# Patient Record
Sex: Male | Born: 1990 | Race: Black or African American | Hispanic: No | Marital: Single | Smoking: Current some day smoker
Health system: Southern US, Community
[De-identification: ages and names within clinical notes are randomized; demographics above are authoritative.]

## PROBLEM LIST (undated history)

## (undated) DIAGNOSIS — J45909 Unspecified asthma, uncomplicated: Secondary | ICD-10-CM

## (undated) DIAGNOSIS — I10 Essential (primary) hypertension: Secondary | ICD-10-CM

## (undated) DIAGNOSIS — E119 Type 2 diabetes mellitus without complications: Secondary | ICD-10-CM

---
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
## 2016-01-10 ENCOUNTER — Emergency Department
Admission: EM | Admit: 2016-01-10 | Discharge: 2016-01-10 | Disposition: A | Discharge status: Home / Self Care | Payer: Commercial Managed Care - PPO | Attending: Emergency Medicine | Admitting: Emergency Medicine

## 2016-01-10 ENCOUNTER — Encounter: Payer: Self-pay | Admitting: Emergency Medicine

## 2016-01-10 DIAGNOSIS — A549 Gonococcal infection, unspecified: Secondary | ICD-10-CM | POA: Diagnosis not present

## 2016-01-10 DIAGNOSIS — R300 Dysuria: Secondary | ICD-10-CM | POA: Diagnosis present

## 2016-01-10 LAB — URINALYSIS COMPLETE WITH MICROSCOPIC (ARMC ONLY)
BILIRUBIN URINE: NEGATIVE
Bacteria, UA: NONE SEEN
GLUCOSE, UA: NEGATIVE mg/dL
KETONES UR: NEGATIVE mg/dL
NITRITE: NEGATIVE
Protein, ur: NEGATIVE mg/dL
SPECIFIC GRAVITY, URINE: 1.019 (ref 1.005–1.030)
Squamous Epithelial / LPF: NONE SEEN
pH: 5 (ref 5.0–8.0)

## 2016-01-10 LAB — CHLAMYDIA/NGC RT PCR (ARMC ONLY)
Chlamydia Tr: NOT DETECTED
N gonorrhoeae: DETECTED — AB

## 2016-01-10 MED ORDER — CEFTRIAXONE SODIUM 250 MG IJ SOLR
250.0000 mg | Freq: Once | INTRAMUSCULAR | Status: AC
  Administered 2016-01-10: 250 mg via INTRAMUSCULAR
  Filled 2016-01-10: qty 250

## 2016-01-10 MED ORDER — AZITHROMYCIN 500 MG PO TABS
1000.0000 mg | ORAL_TABLET | Freq: Once | ORAL | Status: AC
  Administered 2016-01-10: 1000 mg via ORAL
  Filled 2016-01-10: qty 2

## 2016-01-10 NOTE — ED Notes (Signed)
See triage note  Burning with urination  No discharge

## 2016-01-10 NOTE — ED Provider Notes (Signed)
[facility] Emergency Department Provider Note  ____________________________________________  Time seen: Approximately 11:34 AM  I have reviewed the triage vital signs and the nursing notes.   HISTORY  Chief Complaint Dysuria    HPI Rodney PaymentJesse L Banning Jr. is a 25 y.o. male , NAD, presents to the emergency department with 2 day history of dysuria and yellow urethral discharge.States he was sent home early from work today to come to the emergency department for such. Has had some lower abdominal pressure that is alleviated with urination. Denies any fever, chills, night sweats, hematuria. Has had one male sexual partner in the last 6 months but has had no sexual activity in the last 3 months. No history of gonorrhea, chlamydia, trichomoniasis, HIV, hepatitis infections in the past. Patient currently declines HIV and hepatitis evaluation.   History reviewed. No pertinent past medical history.  There are no active problems to display for this patient.   History reviewed. No pertinent past surgical history.  No current outpatient prescriptions on file.  Allergies Review of patient's allergies indicates no known allergies.  No family history on file.  Social History Social History  Substance Use Topics  . Smoking status: Never Smoker   . Smokeless tobacco: None  . Alcohol Use: None     Review of Systems Constitutional: No fever/chills, fatigue, weight loss Cardiovascular: No chest pain. Respiratory:  No shortness of breath. No wheezing.  Gastrointestinal: Positive abdominal pain.  No nausea, vomiting.  No diarrhea.  No constipation. Genitourinary: Positive for dysuria, urethral discharge, urinary urgency, urinary hesitancy and increased frequency. No hematuria. Musculoskeletal: Negative for back pain.  Skin: Negative for rash, skin sores. Neurological: Negative for headaches, focal weakness or numbness. 10-point ROS otherwise  negative.  ____________________________________________   PHYSICAL EXAM:  VITAL SIGNS: ED Triage Vitals  Enc Vitals Group     BP 01/10/16 0928 151/77 mmHg     Pulse Rate 01/10/16 0928 93     Resp 01/10/16 0928 20     Temp 01/10/16 0928 98.9 F (37.2 C)     Temp Source 01/10/16 0928 Oral     SpO2 01/10/16 0928 100 %     Weight 01/10/16 0928 260 lb (117.935 kg)     Height 01/10/16 0928 5\' 8"  (1.727 m)     Head Cir --      Peak Flow --      Pain Score --      Pain Loc --      Pain Edu? --      Excl. in GC? --     Constitutional: Alert and oriented. Well appearing and in no acute distress. Eyes: Conjunctivae are normal.  Head: Atraumatic. Hematological/Lymphatic/Immunilogical: No cervical lymphadenopathy. Cardiovascular: Normal rate, regular rhythm. Normal S1 and S2.  Good peripheral circulation. Respiratory: Normal respiratory effort without tachypnea or retractions. Lungs CTAB. Gastrointestinal: Soft and nontender in all quadrants. No distention, guarding. No CVA tenderness. Neurologic:  Normal speech and language. No gross focal neurologic deficits are appreciated.  Skin:  Skin is warm, dry and intact. No rash noted. Psychiatric: Mood and affect are normal. Speech and behavior are normal. Patient exhibits appropriate insight and judgement.   ____________________________________________   LABS (all labs ordered are listed, but only abnormal results are displayed)  Labs Reviewed  CHLAMYDIA/NGC RT PCR ([facility] ONLY) - Abnormal; Notable for the following:    N gonorrhoeae DETECTED (*)    All other components within normal limits  URINALYSIS COMPLETEWITH MICROSCOPIC ([facility] ONLY) - Abnormal; Notable  for the following:    Color, Urine YELLOW (*)    APPearance CLEAR (*)    Hgb urine dipstick 1+ (*)    Leukocytes, UA TRACE (*)    All other components within normal limits  URINE CULTURE    ____________________________________________  EKG  None ____________________________________________  RADIOLOGY  None ____________________________________________    PROCEDURES  Procedure(s) performed: None    Medications  cefTRIAXone (ROCEPHIN) injection 250 mg (not administered)  azithromycin (ZITHROMAX) tablet 1,000 mg (not administered)     ____________________________________________   INITIAL IMPRESSION / ASSESSMENT AND PLAN / ED COURSE  Pertinent lab results that were available during my care of the patient were reviewed by me and considered in my medical decision making (see chart for details).  Patient's diagnosis is consistent with gonorrhea. Patient was given Rocephin and azithromycin while in the emergency department. Patient is to follow up with [facility] Department in 2 weeks for repeat testing. Patient is advised to rest and from all sexual intercourse until he is seen by the health department for repeat testing. Patient is given ED precautions to return to the ED for any worsening or new symptoms.    ____________________________________________  FINAL CLINICAL IMPRESSION(S) / ED DIAGNOSES  Final diagnoses:  Gonorrhea      NEW MEDICATIONS STARTED DURING THIS VISIT:  New Prescriptions   No medications on file         Hope Pigeon, PA-C 01/10/16 1431  Minna Antis, MD 01/10/16 1506

## 2016-01-10 NOTE — ED Notes (Signed)
Reports burning with urination 

## 2016-01-10 NOTE — Discharge Instructions (Signed)
Gonorrhea Gonorrhea is an infection that can cause serious problems. If left untreated, the infection may:   Damage the male or male organs.   Cause women to be unable to have children (sterility).   Harm a fetus if the infected woman is pregnant.  It is important to get treatment for gonorrhea as soon as possible. It is also necessary that all your sexual partners be tested for the infection.  CAUSES  Gonorrhea is caused by bacteria called Neisseria gonorrhoeae. The infection is spread from person to person, usually by sexual contact (such as by anal, vaginal, or oral means). A newborn can contract the infection from his or her mother during birth.  RISK FACTORS  Being a woman younger than 25 years of age who is sexually active.  Being a woman 25 years of age or older who has:  A new sex partner.  More than one sex partner.  A sex partner who has a sexually transmitted disease (STD).  Using condoms inconsistently.  Currently having, or having previously had, an STD.  Exchanging sex or money or drugs. SYMPTOMS  Some people with gonorrhea do not have symptoms. Symptoms may be different in females and males.  Females The most common symptoms are:   Pain in the lower abdomen.   Fever with or without chills.  Other symptoms include:   Abnormal vaginal discharge.   Painful intercourse.   Burning or itching of the vagina or lips of the vagina.   Abnormal vaginal bleeding.   Pain when urinating.   Long-lasting (chronic) pain in the lower abdomen, especially during menstruation or intercourse.   Inability to become pregnant.   Going into premature labor.   Irritation, pain, bleeding, or discharge from the rectum. This may occur if the infection was spread by anal sex.   Sore throat or swollen lymph nodes in the neck. This may occur if the infection was spread by oral sex.  Males The most common symptoms are:   Discharge from the penis.   Pain  or burning during urination.   Pain or swelling in the testicles. Other symptoms may include:   Irritation, pain, bleeding, or discharge from the rectum. This may occur if the infection was spread by anal sex.   Sore throat, fever, or swollen lymph nodes in the neck. This may occur if the infection was spread by oral sex.  DIAGNOSIS  A diagnosis is made after a physical exam is done and a sample of discharge is examined under a microscope for the presence of the bacteria. The discharge may be taken from the urethra, cervix, throat, or rectum.  TREATMENT  Gonorrhea is treated with antibiotic medicines. It is important for treatment to begin as soon as possible. Early treatment may prevent some problems from developing. Do not have sex. Avoid all types of sexual activity for 7 days after treatment is complete and until any sex partners have been treated. HOME CARE INSTRUCTIONS   Take medicines only as directed by your health care provider.   Take your antibiotic medicine as directed by your health care provider. Finish the antibiotic even if you start to feel better. Incomplete treatment will put you at risk for continued infection.   Do not have sex until treatment is complete or as directed by your health care provider.   Keep all follow-up visits as directed by your health care provider.   Not all test results are available during your visit. If your test results are not back   during the visit, make an appointment with your health care provider to find out the results. Do not assume everything is normal if you have not heard from your health care provider or the medical facility. It is your responsibility to get your test results.  If you test positive for gonorrhea, inform your recent sexual partners. They need to be checked for gonorrhea even if they do not have symptoms. They may need treatment, even if they test negative for gonorrhea.  SEEK MEDICAL CARE IF:   You develop any  bad reaction to the medicine you were prescribed. This may include:   A rash.   Nausea.   Vomiting.   Diarrhea.   Your symptoms do not improve after a few days of taking antibiotics.   Your symptoms get worse.   You develop increased pain, such as in the testicles (for males) or in the abdomen (for females).  You have a fever. MAKE SURE YOU:   Understand these instructions.  Will watch your condition.  Will get help right away if you are not doing well or get worse.   This information is not intended to replace advice given to you by your health care provider. Make sure you discuss any questions you have with your health care provider.   Document Released: 10/02/2000 Document Revised: 10/26/2014 Document Reviewed: 04/12/2013 Elsevier Interactive Patient Education 2016 [employer]  

## 2016-01-12 LAB — URINE CULTURE: Culture: NO GROWTH

## 2016-10-30 ENCOUNTER — Emergency Department
Admission: EM | Admit: 2016-10-30 | Discharge: 2016-10-30 | Disposition: A | Discharge status: Home / Self Care | Payer: Commercial Managed Care - PPO | Attending: Emergency Medicine | Admitting: Emergency Medicine

## 2016-10-30 ENCOUNTER — Encounter: Payer: Self-pay | Admitting: Emergency Medicine

## 2016-10-30 DIAGNOSIS — J45909 Unspecified asthma, uncomplicated: Secondary | ICD-10-CM | POA: Diagnosis not present

## 2016-10-30 DIAGNOSIS — R05 Cough: Secondary | ICD-10-CM | POA: Diagnosis present

## 2016-10-30 DIAGNOSIS — J101 Influenza due to other identified influenza virus with other respiratory manifestations: Secondary | ICD-10-CM

## 2016-10-30 DIAGNOSIS — J09X2 Influenza due to identified novel influenza A virus with other respiratory manifestations: Secondary | ICD-10-CM | POA: Insufficient documentation

## 2016-10-30 DIAGNOSIS — F17210 Nicotine dependence, cigarettes, uncomplicated: Secondary | ICD-10-CM | POA: Diagnosis not present

## 2016-10-30 HISTORY — DX: Unspecified asthma, uncomplicated: J45.909

## 2016-10-30 LAB — INFLUENZA PANEL BY PCR (TYPE A & B)
INFLAPCR: POSITIVE — AB
INFLBPCR: NEGATIVE

## 2016-10-30 LAB — COMPREHENSIVE METABOLIC PANEL
ALBUMIN: 4.1 g/dL (ref 3.5–5.0)
ALK PHOS: 74 U/L (ref 38–126)
ALT: 29 U/L (ref 17–63)
ANION GAP: 10 (ref 5–15)
AST: 41 U/L (ref 15–41)
BILIRUBIN TOTAL: 0.7 mg/dL (ref 0.3–1.2)
BUN: 9 mg/dL (ref 6–20)
CALCIUM: 9.1 mg/dL (ref 8.9–10.3)
CO2: 26 mmol/L (ref 22–32)
Chloride: 102 mmol/L (ref 101–111)
Creatinine, Ser: 1.05 mg/dL (ref 0.61–1.24)
GFR calc non Af Amer: >60 mL/min (ref >60)
GLUCOSE: 105 mg/dL — AB (ref 65–99)
POTASSIUM: 3.6 mmol/L (ref 3.5–5.1)
Sodium: 138 mmol/L (ref 135–145)
TOTAL PROTEIN: 8 g/dL (ref 6.5–8.1)

## 2016-10-30 LAB — LIPASE, BLOOD: LIPASE: 25 U/L (ref 11–51)

## 2016-10-30 LAB — CBC WITH DIFFERENTIAL/PLATELET
BASOS PCT: 0 %
Basophils Absolute: 0 10*3/uL (ref 0–0.1)
EOS ABS: 0 10*3/uL (ref 0–0.7)
Eosinophils Relative: 0 %
HEMATOCRIT: 47.2 % (ref 40.0–52.0)
Hemoglobin: 15.8 g/dL (ref 13.0–18.0)
LYMPHS ABS: 1.7 10*3/uL (ref 1.0–3.6)
Lymphocytes Relative: 32 %
MCH: 28.2 pg (ref 26.0–34.0)
MCHC: 33.4 g/dL (ref 32.0–36.0)
MCV: 84.6 fL (ref 80.0–100.0)
MONO ABS: 0.7 10*3/uL (ref 0.2–1.0)
MONOS PCT: 14 %
NEUTROS ABS: 2.8 10*3/uL (ref 1.4–6.5)
Neutrophils Relative %: 54 %
Platelets: 207 10*3/uL (ref 150–440)
RBC: 5.58 MIL/uL (ref 4.40–5.90)
RDW: 13.2 % (ref 11.5–14.5)
WBC: 5.3 10*3/uL (ref 3.8–10.6)

## 2016-10-30 MED ORDER — HYDROCOD POLST-CPM POLST ER 10-8 MG/5ML PO SUER: 5.0000 mL | Freq: Two times a day (BID) | ORAL | 0 refills | Status: DC

## 2016-10-30 MED ORDER — ONDANSETRON 4 MG PO TBDP: 4.0000 mg | ORAL_TABLET | Freq: Three times a day (TID) | ORAL | 0 refills | Status: DC | PRN

## 2016-10-30 MED ORDER — ONDANSETRON HCL 4 MG/2ML IJ SOLN
4.0000 mg | Freq: Once | INTRAMUSCULAR | Status: AC
  Administered 2016-10-30: 4 mg via INTRAVENOUS
  Filled 2016-10-30: qty 2

## 2016-10-30 MED ORDER — SODIUM CHLORIDE 0.9 % IV SOLN
Freq: Once | INTRAVENOUS | Status: AC
  Administered 2016-10-30: 07:00:00 via INTRAVENOUS

## 2016-10-30 MED ORDER — MORPHINE SULFATE (PF) 4 MG/ML IV SOLN
4.0000 mg | Freq: Once | INTRAVENOUS | Status: AC
  Administered 2016-10-30: 4 mg via INTRAVENOUS
  Filled 2016-10-30: qty 1

## 2016-10-30 NOTE — ED Provider Notes (Signed)
[facility] Emergency Department Provider Note        Time seen: ----------------------------------------- 6:55 AM on 10/30/2016 -----------------------------------------    I have reviewed the triage vital signs and the nursing notes.   HISTORY  Chief Complaint Abdominal Pain; Cough; and Headache    HPI Rodney PaymentJesse L Vanpelt Jr. is a 26 y.o. male who presents to the ER for cough, congestion and lower abdominal pain for the last week.Patient states she's had fevers, chills, body aches as well as vomiting. He did have diarrhea earlier in the week but this has resolved. Currently still nauseous and has had poor by mouth intake.   Past Medical History:  Diagnosis Date  . Asthma     There are no active problems to display for this patient.   History reviewed. No pertinent surgical history.  Allergies Patient has no known allergies.  Social History Social History  Substance Use Topics  . Smoking status: Current Some Day Smoker  . Smokeless tobacco: Never Used  . Alcohol use No    Review of Systems Constitutional: Negative for fever. Cardiovascular: Negative for chest pain. Respiratory: Negative for shortness of breath. Gastrointestinal: Positive for abdominal pain, vomiting Genitourinary: Negative for dysuria. Musculoskeletal: Negative for back pain. Skin: Negative for rash. Positive for sweats Neurological: Negative for headaches, focal weakness or numbness.  10-point ROS otherwise negative.  ____________________________________________   PHYSICAL EXAM:  VITAL SIGNS: ED Triage Vitals [10/30/16 0621]  Enc Vitals Group     BP 126/85     Pulse Rate (!) 118     Resp 18     Temp 98.3 F (36.8 C)     Temp Source Oral     SpO2 95 %     Weight 275 lb (124.7 kg)     Height 5\' 8"  (1.727 m)     Head Circumference      Peak Flow      Pain Score      Pain Loc      Pain Edu?      Excl. in GC?     Constitutional: Alert and oriented. Well  appearing and in no distress. Eyes: Conjunctivae are normal. PERRL. Normal extraocular movements. ENT   Head: Normocephalic and atraumatic.   Nose: No congestion/rhinnorhea.   Mouth/Throat: Mucous membranes are moist.   Neck: No stridor. Cardiovascular: Rapid rate, regular rhythm. No murmurs, rubs, or gallops. Respiratory: Normal respiratory effort without tachypnea nor retractions. Breath sounds are clear and equal bilaterally. No wheezes/rales/rhonchi. Gastrointestinal: Soft and nontender. Normal bowel sounds Musculoskeletal: Nontender with normal range of motion in all extremities. No lower extremity tenderness nor edema. Neurologic:  Normal speech and language. No gross focal neurologic deficits are appreciated.  Skin:  Skin is warm, dry and intact. No rash noted. Psychiatric: Mood and affect are normal. Speech and behavior are normal.  ____________________________________________  ED COURSE:  Pertinent labs & imaging results that were available during my care of the patient were reviewed by me and considered in my medical decision making (see chart for details). Clinical Course   Patient is in no distress, likely influenza. We will assess with labs and imaging.  Procedures ____________________________________________   LABS (pertinent positives/negatives)  Labs Reviewed  COMPREHENSIVE METABOLIC PANEL - Abnormal; Notable for the following:       Result Value   Glucose, Bld 105 (*)    All other components within normal limits  INFLUENZA PANEL BY PCR (TYPE A & B, H1N1) - Abnormal; Notable for the  following:    Influenza A By PCR POSITIVE (*)    All other components within normal limits  CBC WITH DIFFERENTIAL/PLATELET  LIPASE, BLOOD   ____________________________________________  FINAL ASSESSMENT AND PLAN  Influenza  Plan: Patient with labs as dictated above. Patient's no acute distress, tolerating liquids well here. Symptoms are secondary to influenza. He'll  be discharged with antiemetics, cough medicine and he is stable for outpatient follow-up.   Emily Filbert, MD   Note: This dictation was prepared with Dragon dictation. Any transcriptional errors that result from this process are unintentional    Emily Filbert, MD 10/30/16 [phone]

## 2016-10-30 NOTE — ED Triage Notes (Signed)
Pt ambulatory to triage with steady gait, pt diaphoretic. Pt c/o cough, congestion, lower abdominal pain x1 week.

## 2017-01-24 ENCOUNTER — Encounter: Payer: Self-pay | Admitting: Emergency Medicine

## 2017-01-24 ENCOUNTER — Emergency Department
Admission: EM | Admit: 2017-01-24 | Discharge: 2017-01-24 | Disposition: A | Discharge status: Home / Self Care | Payer: Commercial Managed Care - PPO | Attending: Emergency Medicine | Admitting: Emergency Medicine

## 2017-01-24 DIAGNOSIS — J45909 Unspecified asthma, uncomplicated: Secondary | ICD-10-CM | POA: Diagnosis not present

## 2017-01-24 DIAGNOSIS — J029 Acute pharyngitis, unspecified: Secondary | ICD-10-CM | POA: Insufficient documentation

## 2017-01-24 DIAGNOSIS — F17210 Nicotine dependence, cigarettes, uncomplicated: Secondary | ICD-10-CM | POA: Insufficient documentation

## 2017-01-24 DIAGNOSIS — R05 Cough: Secondary | ICD-10-CM | POA: Diagnosis present

## 2017-01-24 LAB — POCT RAPID STREP A: Streptococcus, Group A Screen (Direct): NEGATIVE

## 2017-01-24 MED ORDER — GI COCKTAIL ~~LOC~~
30.0000 mL | Freq: Once | ORAL | Status: AC
  Administered 2017-01-24: 30 mL via ORAL

## 2017-01-24 MED ORDER — CEPHALEXIN 500 MG PO CAPS: 500.0000 mg | ORAL_CAPSULE | Freq: Two times a day (BID) | ORAL | 0 refills | Status: DC

## 2017-01-24 MED ORDER — ACETAMINOPHEN 500 MG PO TABS
1000.0000 mg | ORAL_TABLET | Freq: Once | ORAL | Status: AC
  Administered 2017-01-24: 1000 mg via ORAL
  Filled 2017-01-24: qty 2

## 2017-01-24 MED ORDER — GI COCKTAIL ~~LOC~~
ORAL | Status: AC
  Administered 2017-01-24: 30 mL via ORAL
  Filled 2017-01-24: qty 30

## 2017-01-24 NOTE — ED Triage Notes (Signed)
Pt reports 2 days of headache, sore throat, bilateral ear pain and productive cough of green sputum; pt diaphoretic in triage;

## 2017-01-24 NOTE — ED Provider Notes (Signed)
[facility] Emergency Department Provider Note  Time seen: 7:22 AM  I have reviewed the triage vital signs and the nursing notes.   HISTORY  Chief Complaint Sore Throat; Cough; and Headache    HPI Rodney Jones. is a 26 y.o. male with a past medical history of asthma who presents to the emergency department with 2 days of sore throat and headache. According to the patient for the past 2 days he's had a significant sore throat with pain with swallowing. Denies any trouble breathing, denies any trouble swallowing. Patient denies known fever. States occasional cough states moderate headache. Denies any chest pain or abdominal pain. Denies nausea vomiting or diarrhea. Denies body aches.  Past Medical History:  Diagnosis Date  . Asthma     There are no active problems to display for this patient.   History reviewed. No pertinent surgical history.  Prior to Admission medications   Not on File    No Known Allergies  History reviewed. No pertinent family history.  Social History Social History  Substance Use Topics  . Smoking status: Current Some Day Smoker    Types: Cigarettes  . Smokeless tobacco: Never Used  . Alcohol use No    Review of Systems Constitutional: Negative for fever. ENT: Positive for sore throat Cardiovascular: Negative for chest pain. Respiratory: Negative for shortness of breath. Occasional cough Gastrointestinal: Negative for abdominal pain Musculoskeletal: Negative for back pain. Neurological: Mild to moderate headache. 10-point ROS otherwise negative.  ____________________________________________   PHYSICAL EXAM:  VITAL SIGNS: ED Triage Vitals  Enc Vitals Group     BP 01/24/17 0631 (!) 145/103     Pulse Rate 01/24/17 0631 (!) 116     Resp 01/24/17 0631 20     Temp --      Temp Source 01/24/17 0631 Oral     SpO2 01/24/17 0631 97 %     Weight 01/24/17 0632 (!) 316 lb 8 oz (143.6 kg)     Height 01/24/17 0632 5'  8" (1.727 m)     Head Circumference --      Peak Flow --      Pain Score 01/24/17 0635 8     Pain Loc --      Pain Edu? --      Excl. in GC? --     Constitutional: Alert and oriented. Well appearing and in no distress. Eyes: Normal exam ENT   Head: Normocephalic and atraumatic.Normal tympanic membranes.   Mouth/Throat: Mucous membranes are moist.Moderate pharyngeal erythema with mild to moderate bilateral tonsillar hypertrophy without uvular deviation. Positive for bilateral tonsillar exudates. Cardiovascular: Normal rate, regular rhythm around 100 bpm. Respiratory: Normal respiratory effort without tachypnea nor retractions. Breath sounds are clear Gastrointestinal: Soft and nontender. No distention.   Musculoskeletal: Nontender with normal range of motion in all extremities.  Neurologic:  Normal speech and language. No gross focal neurologic deficits  Skin:  Skin is warm, dry and intact.  Psychiatric: Mood and affect are normal.   ____________________________________________   INITIAL IMPRESSION / ASSESSMENT AND PLAN / ED COURSE  Pertinent labs & imaging results that were available during my care of the patient were reviewed by me and considered in my medical decision making (see chart for details).  The patient presents the emergency department with 2 days of sore throat, headache, occasional cough. On exam the patient has pharyngitis with tonsillar exudates. We will swab for strep. We will treat with Tylenol. Overall the patient appears well,  no distress.  Strep is negative however based on complaints and the appearance of the tonsils we will cover with Keflex. I discussed supportive care at home as well as return precautions. ____________________________________________   FINAL CLINICAL IMPRESSION(S) / ED DIAGNOSES  Pharyngitis    Minna Antis, MD 01/24/17 0800

## 2017-01-24 NOTE — ED Notes (Signed)
Patient c/o nausea, productive cough, sore throat and headache X 2 days.

## 2019-05-01 ENCOUNTER — Other Ambulatory Visit: Payer: Self-pay

## 2019-05-01 ENCOUNTER — Emergency Department
Admission: EM | Admit: 2019-05-01 | Discharge: 2019-05-01 | Disposition: A | Discharge status: Home / Self Care | Payer: Commercial Managed Care - PPO | Attending: Emergency Medicine | Admitting: Emergency Medicine

## 2019-05-01 ENCOUNTER — Encounter: Payer: Self-pay | Admitting: Emergency Medicine

## 2019-05-01 DIAGNOSIS — J039 Acute tonsillitis, unspecified: Secondary | ICD-10-CM | POA: Insufficient documentation

## 2019-05-01 DIAGNOSIS — J45909 Unspecified asthma, uncomplicated: Secondary | ICD-10-CM | POA: Insufficient documentation

## 2019-05-01 DIAGNOSIS — Z20828 Contact with and (suspected) exposure to other viral communicable diseases: Secondary | ICD-10-CM | POA: Insufficient documentation

## 2019-05-01 DIAGNOSIS — F17210 Nicotine dependence, cigarettes, uncomplicated: Secondary | ICD-10-CM | POA: Insufficient documentation

## 2019-05-01 DIAGNOSIS — J029 Acute pharyngitis, unspecified: Secondary | ICD-10-CM

## 2019-05-01 MED ORDER — DEXAMETHASONE 1 MG/ML PO CONC
10.0000 mg | Freq: Once | ORAL | Status: AC
  Administered 2019-05-01: 05:00:00 10 mg via ORAL
  Filled 2019-05-01: qty 10

## 2019-05-01 MED ORDER — AMOXICILLIN 500 MG PO CAPS
500.0000 mg | ORAL_CAPSULE | Freq: Once | ORAL | Status: AC
  Administered 2019-05-01: 05:00:00 500 mg via ORAL
  Filled 2019-05-01: qty 1

## 2019-05-01 MED ORDER — AMOXICILLIN 500 MG PO CAPS: 500.0000 mg | ORAL_CAPSULE | Freq: Three times a day (TID) | ORAL | 0 refills | Status: DC

## 2019-05-01 NOTE — Discharge Instructions (Signed)
1.  Take antibiotic as prescribed (Amoxicillin 500 mg 3 times daily x7 days). 2.  Return to the ER for worsening symptoms, persistent vomiting, difficulty breathing or other concerns.

## 2019-05-01 NOTE — ED Provider Notes (Signed)
[facility] Emergency Department Provider Note   ____________________________________________   First MD Initiated Contact with Patient 05/01/19 0422     (approximate)  I have reviewed the triage vital signs and the nursing notes.   HISTORY  Chief Complaint Headache and Sore Throat    HPI Rodney Jones. is a 28 y.o. male sent to the ED from work at [employer] for medical clearance to go back to work.  Patient reports sore throat and mild frontal headache x2 days.  States he was sleeping in his car with the air conditioning going for blast and thinks that is why his throat is sore.  Denies fever, cough, chest pain, shortness of breath, abdominal pain, nausea or vomiting.  Denies recent travel, trauma or exposure to persons diagnosed with coronavirus.       Past Medical History:  Diagnosis Date  . Asthma     There are no active problems to display for this patient.   History reviewed. No pertinent surgical history.  Prior to Admission medications   Medication Sig Start Date End Date Taking? Authorizing Provider  amoxicillin (AMOXIL) 500 MG capsule Take 1 capsule (500 mg total) by mouth 3 (three) times daily. 05/01/19   Paulette Blanch, MD  cephALEXin (KEFLEX) 500 MG capsule Take 1 capsule (500 mg total) by mouth 2 (two) times daily. 01/24/17   Harvest Dark, MD    Allergies Patient has no known allergies.  No family history on file.  Social History Social History   Tobacco Use  . Smoking status: Current Some Day Smoker    Types: Cigarettes  . Smokeless tobacco: Never Used  Substance Use Topics  . Alcohol use: Yes  . Drug use: No    Review of Systems  Constitutional: No fever/chills Eyes: No visual changes. ENT: Positive for sore throat. Cardiovascular: Denies chest pain. Respiratory: Denies shortness of breath. Gastrointestinal: No abdominal pain.  No nausea, no vomiting.  No diarrhea.  No constipation. Genitourinary: Negative  for dysuria. Musculoskeletal: Negative for back pain. Skin: Negative for rash. Neurological: Positive for headache.  Negative for focal weakness or numbness.   ____________________________________________   PHYSICAL EXAM:  VITAL SIGNS: ED Triage Vitals  Enc Vitals Group     BP 05/01/19 0413 (!) 154/88     Pulse Rate 05/01/19 0413 (!) 106     Resp 05/01/19 0413 18     Temp 05/01/19 0413 98.5 F (36.9 C)     Temp Source 05/01/19 0413 Oral     SpO2 05/01/19 0413 98 %     Weight 05/01/19 0409 (!) 325 lb (147.4 kg)     Height 05/01/19 0409 5\' 8"  (1.727 m)     Head Circumference --      Peak Flow --      Pain Score 05/01/19 0409 4     Pain Loc --      Pain Edu? --      Excl. in [place]? --     Constitutional: Alert and oriented. Well appearing and in no acute distress. Eyes: Conjunctivae are normal. PERRL. EOMI. Head: Atraumatic. Nose: No congestion/rhinnorhea. Mouth/Throat: Mucous membranes are moist.  Oropharynx moderately erythematous with symmetrically swollen tonsils with exudates.  No peritonsillar abscess.  There is no hoarse or muffled voice.  There is no drooling. Neck: No stridor.  Supple neck without meningismus. Hematological/Lymphatic/Immunilogical: No cervical lymphadenopathy. Cardiovascular: Normal rate, regular rhythm. Grossly normal heart sounds.  Good peripheral circulation. Respiratory: Normal respiratory effort.  No  retractions. Lungs CTAB. Gastrointestinal: Soft and nontender. No distention. No abdominal bruits. No CVA tenderness. Musculoskeletal: No lower extremity tenderness nor edema.  No joint effusions. Neurologic:  Normal speech and language. No gross focal neurologic deficits are appreciated. No gait instability. Skin:  Skin is warm, dry and intact. No rash noted.  No petechiae. Psychiatric: Mood and affect are normal. Speech and behavior are normal.  ____________________________________________   LABS (all labs ordered are listed, but only abnormal  results are displayed)  Labs Reviewed - No data to display ____________________________________________  EKG  None ____________________________________________  RADIOLOGY  ED MD interpretation: None  Official radiology report(s): No results found.  ____________________________________________   PROCEDURES  Procedure(s) performed (including Critical Care):  Procedures   ____________________________________________   INITIAL IMPRESSION / ASSESSMENT AND PLAN / ED COURSE  As part of my medical decision making, I reviewed the following data within the electronic MEDICAL RECORD NUMBER Nursing notes reviewed and incorporated and Notes from prior ED visits     Rodney PaymentJesse L Belay Jr. was evaluated in Emergency Department on 05/01/2019 for the symptoms described in the history of present illness. He was evaluated in the context of the global COVID-19 pandemic, which necessitated consideration that the patient might be at risk for infection with the SARS-CoV-2 virus that causes COVID-19. Institutional protocols and algorithms that pertain to the evaluation of patients at risk for COVID-19 are in a state of rapid change based on information released by regulatory bodies including the CDC and federal and state organizations. These policies and algorithms were followed during the patient's care in the ED.   28 year old male who presents with sore throat and mild headache.  Clinical exam reveals enlarged tonsils with exudates.  Will give Decadron in the ED and start amoxicillin.  Strict return precautions given.  Patient verbalizes understanding and agrees with plan of care.      ____________________________________________   FINAL CLINICAL IMPRESSION(S) / ED DIAGNOSES  Final diagnoses:  Sore throat  Tonsillitis     ED Discharge Orders         Ordered    amoxicillin (AMOXIL) 500 MG capsule  3 times daily     05/01/19 0435           Note:  This document was prepared using Dragon  voice recognition software and may include unintentional dictation errors.   Irean HongSung, Kaisha Wachob J, MD 05/01/19 [phone]

## 2019-05-01 NOTE — ED Notes (Signed)
ED Provider at bedside. 

## 2019-05-01 NOTE — ED Notes (Signed)
Reviewed discharge instructions, follow-up care, and prescriptions with patient. Patient verbalized understanding of all information reviewed. Patient stable, with no distress noted at this time.    

## 2019-05-01 NOTE — ED Triage Notes (Signed)
Patient with complaint of sore throat and headache that started yesterday. Patient states that his boss will not let him come back to work until he has been checked out.

## 2019-05-02 LAB — NOVEL CORONAVIRUS, NAA (HOSP ORDER, SEND-OUT TO REF LAB; TAT 18-24 HRS): SARS-CoV-2, NAA: NOT DETECTED

## 2019-05-03 ENCOUNTER — Telehealth: Payer: Self-pay | Admitting: Emergency Medicine

## 2019-05-03 NOTE — Telephone Encounter (Signed)
Called patient to inform of negative covid test result. Number is disconnected.

## 2019-05-08 ENCOUNTER — Emergency Department
Admission: EM | Admit: 2019-05-08 | Discharge: 2019-05-08 | Disposition: A | Discharge status: Home / Self Care | Payer: Self-pay | Attending: Emergency Medicine | Admitting: Emergency Medicine

## 2019-05-08 ENCOUNTER — Other Ambulatory Visit: Payer: Self-pay

## 2019-05-08 ENCOUNTER — Encounter: Payer: Self-pay | Admitting: Emergency Medicine

## 2019-05-08 DIAGNOSIS — J029 Acute pharyngitis, unspecified: Secondary | ICD-10-CM | POA: Insufficient documentation

## 2019-05-08 DIAGNOSIS — F17210 Nicotine dependence, cigarettes, uncomplicated: Secondary | ICD-10-CM | POA: Insufficient documentation

## 2019-05-08 DIAGNOSIS — J45909 Unspecified asthma, uncomplicated: Secondary | ICD-10-CM | POA: Insufficient documentation

## 2019-05-08 LAB — GROUP A STREP BY PCR: Group A Strep by PCR: NOT DETECTED

## 2019-05-08 MED ORDER — DEXAMETHASONE 10 MG/ML FOR PEDIATRIC ORAL USE
10.0000 mg | Freq: Once | INTRAMUSCULAR | Status: AC
  Administered 2019-05-08: 10 mg via ORAL
  Filled 2019-05-08: qty 1

## 2019-05-08 MED ORDER — IBUPROFEN 600 MG PO TABS
600.0000 mg | ORAL_TABLET | Freq: Once | ORAL | Status: AC
  Administered 2019-05-08: 600 mg via ORAL
  Filled 2019-05-08: qty 1

## 2019-05-08 NOTE — ED Triage Notes (Signed)
Pt arrives POV to triage with s/o sore throat x1 day. Pt states that he was seen last weekend and his symptoms had resolved until yesterday. Pt states that he needs a work not stating that his Covid swab from last week was negative. Pt is in NAD.

## 2019-05-08 NOTE — ED Provider Notes (Signed)
[facility] Emergency Department Provider Note  ____________________________________________  Time seen: Approximately 5:55 AM  I have reviewed the triage vital signs and the nursing notes.   HISTORY  Chief Complaint Sore Throat   HPI Rodney Jones. is a 28 y.o. male presents for evaluation of sore throat.  Patient was seen here 7 days ago for the same.  Reports that the sore throat was getting better but today felt worse.  He is complaining of moderate sharp pain worse with swallowing.  Is able to handle his saliva, no difficulty breathing, no fever, no cough, no chest pain, no shortness of breath, no vomiting, no diarrhea.  He is still taking the amoxicillin that was prescribed for him last time.  Patient reports that the Decadron really helped for the first 2 days and is requesting repeat dose   Past Medical History:  Diagnosis Date  . Asthma      Prior to Admission medications   Medication Sig Start Date End Date Taking? Authorizing Provider  amoxicillin (AMOXIL) 500 MG capsule Take 1 capsule (500 mg total) by mouth 3 (three) times daily. 05/01/19   Paulette Blanch, MD  cephALEXin (KEFLEX) 500 MG capsule Take 1 capsule (500 mg total) by mouth 2 (two) times daily. 01/24/17   Harvest Dark, MD    Allergies Patient has no known allergies.  No family history on file.  Social History Social History   Tobacco Use  . Smoking status: Current Some Day Smoker    Types: Cigarettes  . Smokeless tobacco: Never Used  Substance Use Topics  . Alcohol use: Yes  . Drug use: No    Review of Systems  Constitutional: Negative for fever. Eyes: Negative for visual changes. ENT:+ sore throat. Neck: No neck pain  Cardiovascular: Negative for chest pain. Respiratory: Negative for shortness of breath. Gastrointestinal: Negative for abdominal pain, vomiting or diarrhea. Genitourinary: Negative for dysuria. Musculoskeletal: Negative for back pain. Skin:  Negative for rash. Neurological: Negative for headaches, weakness or numbness. Psych: No SI or HI  ____________________________________________   PHYSICAL EXAM:  VITAL SIGNS: ED Triage Vitals [05/08/19 0503]  Enc Vitals Group     BP (!) 160/115     Pulse Rate 100     Resp 18     Temp 98.7 F (37.1 C)     Temp Source Oral     SpO2 97 %     Weight (!) 315 lb (142.9 kg)     Height 5\' 8"  (1.727 m)     Head Circumference      Peak Flow      Pain Score 0     Pain Loc      Pain Edu?      Excl. in [place]?     Constitutional: Alert and oriented. Well appearing and in no apparent distress. HEENT:      Head: Normocephalic and atraumatic.         Eyes: Conjunctivae are normal. Sclera is non-icteric.       Mouth/Throat: Mucous membranes are moist.  Oropharynx is clear with no tonsillar erythema, no exudates, no peritonsillar abscess, normal voice, no hoarseness, no muffled voice.  Patient is not drooling, airways patent.      Neck: Supple with no signs of meningismus. Cardiovascular: Regular rate and rhythm.  Respiratory: Normal respiratory effort.  Musculoskeletal: Nontender with normal range of motion in all extremities. No edema, cyanosis, or erythema of extremities. Neurologic: Normal speech and language. Face is  symmetric. Moving all extremities. No gross focal neurologic deficits are appreciated. Skin: Skin is warm, dry and intact. No rash noted. Psychiatric: Mood and affect are normal. Speech and behavior are normal.  ____________________________________________   LABS (all labs ordered are listed, but only abnormal results are displayed)  Labs Reviewed  GROUP A STREP BY PCR   ____________________________________________  EKG  none  ____________________________________________  RADIOLOGY  none  ____________________________________________   PROCEDURES  Procedure(s) performed: None Procedures Critical Care performed:  None  ____________________________________________   INITIAL IMPRESSION / ASSESSMENT AND PLAN / ED COURSE   28 y.o. male presents for evaluation of sore throat.  Seen here 7 days ago for the same with negative COVID swab.  At that time patient was noted to have swollen tonsils with exudates.  At this time, patient is extremely well-appearing and in no distress with normal vital signs, exam is very benign with no exudates or erythema of his tonsils, no evidence of peritonsillar abscess.  Airways patent with no stridor, no drooling, no changes in voice.  Patient reports significant improvement after Decadron therefore will re-dose it.  Strep swab negative.  Recommended continue amoxicillin as prescribed.  Recommended follow-up with PCP.  Discussed my standard return precautions.       As part of my medical decision making, I reviewed the following data within the electronic MEDICAL RECORD NUMBER Nursing notes reviewed and incorporated, Old chart reviewed, Notes from prior ED visits and [place] Controlled Substance Database   Patient was evaluated in Emergency Department today for the symptoms described in the history of present illness. Patient was evaluated in the context of the global COVID-19 pandemic, which necessitated consideration that the patient might be at risk for infection with the SARS-CoV-2 virus that causes COVID-19. Institutional protocols and algorithms that pertain to the evaluation of patients at risk for COVID-19 are in a state of rapid change based on information released by regulatory bodies including the CDC and federal and state organizations. These policies and algorithms were followed during the patient's care in the ED.   ____________________________________________   FINAL CLINICAL IMPRESSION(S) / ED DIAGNOSES   Final diagnoses:  Pharyngitis, unspecified etiology      NEW MEDICATIONS STARTED DURING THIS VISIT:  ED Discharge Orders    None       Note:  This document was  prepared using Dragon voice recognition software and may include unintentional dictation errors.    Nita SickleVeronese, Trainer, MD 05/08/19 [phone]

## 2019-05-08 NOTE — Discharge Instructions (Signed)
Continue the antibiotics as prescribed.  Return to the emergency room for pain with swallowing, difficulty swallowing, changes in your voice, fever.

## 2019-05-26 ENCOUNTER — Encounter: Payer: Self-pay | Admitting: Emergency Medicine

## 2019-05-26 ENCOUNTER — Other Ambulatory Visit: Payer: Self-pay

## 2019-05-26 ENCOUNTER — Emergency Department
Admission: EM | Admit: 2019-05-26 | Discharge: 2019-05-26 | Disposition: A | Discharge status: Home / Self Care | Payer: Self-pay | Attending: Emergency Medicine | Admitting: Emergency Medicine

## 2019-05-26 DIAGNOSIS — A545 Gonococcal pharyngitis: Secondary | ICD-10-CM | POA: Insufficient documentation

## 2019-05-26 DIAGNOSIS — J029 Acute pharyngitis, unspecified: Secondary | ICD-10-CM | POA: Insufficient documentation

## 2019-05-26 DIAGNOSIS — F17210 Nicotine dependence, cigarettes, uncomplicated: Secondary | ICD-10-CM | POA: Insufficient documentation

## 2019-05-26 DIAGNOSIS — Z113 Encounter for screening for infections with a predominantly sexual mode of transmission: Secondary | ICD-10-CM | POA: Insufficient documentation

## 2019-05-26 DIAGNOSIS — J45909 Unspecified asthma, uncomplicated: Secondary | ICD-10-CM | POA: Insufficient documentation

## 2019-05-26 LAB — CHLAMYDIA/NGC RT PCR (ARMC ONLY)
Chlamydia Tr: NOT DETECTED
Chlamydia Tr: NOT DETECTED
N gonorrhoeae: DETECTED — AB
N gonorrhoeae: NOT DETECTED

## 2019-05-26 LAB — GROUP A STREP BY PCR: Group A Strep by PCR: NOT DETECTED

## 2019-05-26 MED ORDER — LIDOCAINE VISCOUS HCL 2 % MT SOLN: 15.0000 mL | Freq: Four times a day (QID) | OROMUCOSAL | 0 refills | Status: DC | PRN

## 2019-05-26 NOTE — Discharge Instructions (Signed)
Follow-up with [facility] if not improving in and 1 to 2 weeks.  Return to the emergency department if worsening.  Your test results will not be back for several hours and one may not be back for 2 to 3 days.  We will call you with the results and recommend treatment at that time.

## 2019-05-26 NOTE — ED Notes (Addendum)
Explained to pt about wait times for tests. PA Manuela Schwartz stated that she would like pt to wait for results but is giving him the option to leave and be called with results later. Informed that further testing may be needed depending on test results. Pt stated he would like to leave and go back to work. PA Manuela Schwartz informed.

## 2019-05-26 NOTE — ED Provider Notes (Signed)
[facility] Emergency Department Provider Note  ____________________________________________   First MD Initiated Contact with Patient 05/26/19 1005     (approximate)  I have reviewed the triage vital signs and the nursing notes.   HISTORY  Chief Complaint Sore Throat    HPI Rodney Rouch. is a 28 y.o. male presents emergency department complaining of a sore throat.  Patient has been seen several times for sore throat and previously for STD.  Strep test have been negative in the past.  He has been on Keflex and amoxicillin without any relief.  He states he feels like his throat is a swollen as it was when it first started.  Difficulty swallowing.  When asked about if this could possibly be an STD in the throat versus strep pieces could be.  He would like to be tested for both.  He denies fever chills at this time.  Denies any COVID-19 symptoms.    Past Medical History:  Diagnosis Date  . Asthma     There are no active problems to display for this patient.   History reviewed. No pertinent surgical history.  Prior to Admission medications   Medication Sig Start Date End Date Taking? Authorizing Provider  lidocaine (XYLOCAINE) 2 % solution Use as directed 15 mLs in the mouth or throat every 6 (six) hours as needed for mouth pain. 05/26/19   Versie Starks, PA-C    Allergies Patient has no known allergies.  No family history on file.  Social History Social History   Tobacco Use  . Smoking status: Current Some Day Smoker    Types: Cigarettes  . Smokeless tobacco: Never Used  Substance Use Topics  . Alcohol use: Yes  . Drug use: No    Review of Systems  Constitutional: No fever/chills Eyes: No visual changes. ENT: Positive sore throat. Respiratory: Denies cough Genitourinary: Negative for dysuria. Musculoskeletal: Negative for back pain. Skin: Negative for rash.    ____________________________________________   PHYSICAL EXAM:  VITAL SIGNS: ED Triage Vitals  Enc Vitals Group     BP 05/26/19 0956 (!) 128/98     Pulse Rate 05/26/19 0956 (!) 110     Resp 05/26/19 0956 16     Temp 05/26/19 0956 98.6 F (37 C)     Temp Source 05/26/19 0956 Oral     SpO2 05/26/19 0956 98 %     Weight 05/26/19 1000 (!) 320 lb (145.2 kg)     Height 05/26/19 1000 5\' 8"  (1.727 m)     Head Circumference --      Peak Flow --      Pain Score 05/26/19 1000 6     Pain Loc --      Pain Edu? --      Excl. in [place]? --     Constitutional: Alert and oriented. Well appearing and in no acute distress. Eyes: Conjunctivae are normal.  Head: Atraumatic. Nose: No congestion/rhinnorhea. Mouth/Throat: Mucous membranes are moist.  Throat is red and swollen Neck:  supple no lymphadenopathy noted Cardiovascular: Normal rate, regular rhythm. Heart sounds are normal Respiratory: Normal respiratory effort.  No retractions, lungs c t a  GU: deferred Musculoskeletal: FROM all extremities, warm and well perfused Neurologic:  Normal speech and language.  Skin:  Skin is warm, dry and intact. No rash noted. Psychiatric: Mood and affect are normal. Speech and behavior are normal.  ____________________________________________   LABS (all labs ordered are listed, but only abnormal results are displayed)  Labs Reviewed  CHLAMYDIA/NGC RT PCR ([facility] ONLY) - Abnormal; Notable for the following components:      Result Value   N gonorrhoeae DETECTED (*)    All other components within normal limits  GROUP A STREP BY PCR  CHLAMYDIA/NGC RT PCR ([facility] ONLY)   ____________________________________________   ____________________________________________  RADIOLOGY    ____________________________________________   PROCEDURES  Procedure(s) performed: No  Procedures    ____________________________________________   INITIAL IMPRESSION / ASSESSMENT AND PLAN / ED COURSE  Pertinent labs & imaging results that were available during  my care of the patient were reviewed by me and considered in my medical decision making (see chart for details).   Patient is 28 year old male presents emergency department with sore throat for 1 month.  Patient's had a negative strep test approximately 2 weeks ago.  He is also had Keflex and amoxicillin without any relief.  A question about STDs he states could possibly be as he has been positive before.  Physical exam shows the throat to be bright red and swollen.  Strep test ordered, GC/chlamydia throat cultures ordered  Patient does not want to wait for the results.  Informed him I will call him with the results for treatment.  He was given a prescription for viscous lidocaine.  ----------------------------------------- 3:21 PM on 05/26/2019 -----------------------------------------  Called the patient to notify him of results but had to leave a message.  Gave him the phone number to call back for the results so that he may come in be treated with Rocephin and Zithromax.    Rodney PaymentJesse L Schaab Jr. was evaluated in Emergency Department on 05/26/2019 for the symptoms described in the history of present illness. He was evaluated in the context of the global COVID-19 pandemic, which necessitated consideration that the patient might be at risk for infection with the SARS-CoV-2 virus that causes COVID-19. Institutional protocols and algorithms that pertain to the evaluation of patients at risk for COVID-19 are in a state of rapid change based on information released by regulatory bodies including the CDC and federal and state organizations. These policies and algorithms were followed during the patient's care in the ED.   As part of my medical decision making, I reviewed the following data within the electronic MEDICAL RECORD NUMBER Nursing notes reviewed and incorporated, Labs reviewed throat culture is positive for gonorrhea, Old chart reviewed, Notes from prior ED visits and [place] Controlled Substance Database   ____________________________________________   FINAL CLINICAL IMPRESSION(S) / ED DIAGNOSES  Final diagnoses:  Acute pharyngitis, unspecified etiology  Acute gonococcal pharyngitis      NEW MEDICATIONS STARTED DURING THIS VISIT:  Discharge Medication List as of 05/26/2019 10:31 AM    START taking these medications   Details  lidocaine (XYLOCAINE) 2 % solution Use as directed 15 mLs in the mouth or throat every 6 (six) hours as needed for mouth pain., Starting Fri 05/26/2019, Normal         Note:  This document was prepared using Dragon voice recognition software and may include unintentional dictation errors.    Faythe GheeFisher, Vesna Kable W, PA-C 05/26/19 1523    Shaune PollackIsaacs, [place], MD 05/26/19 [phone]

## 2019-05-26 NOTE — ED Triage Notes (Signed)
Patient presents to the ED with a sore throat x 1 month.  Patient states he was seen in the ED twice in the past month and diagnosed with tonsillitis.  Patient states he improved with taking the medication but now his throat pain has increased.  Patient is in no obvious distress at this time.  Denies difficulty breathing.

## 2019-05-27 ENCOUNTER — Emergency Department
Admission: EM | Admit: 2019-05-27 | Discharge: 2019-05-27 | Disposition: A | Discharge status: Home / Self Care | Payer: Self-pay | Attending: Student | Admitting: Student

## 2019-05-27 ENCOUNTER — Other Ambulatory Visit: Payer: Self-pay

## 2019-05-27 DIAGNOSIS — F17210 Nicotine dependence, cigarettes, uncomplicated: Secondary | ICD-10-CM | POA: Insufficient documentation

## 2019-05-27 DIAGNOSIS — A545 Gonococcal pharyngitis: Secondary | ICD-10-CM | POA: Insufficient documentation

## 2019-05-27 DIAGNOSIS — J45909 Unspecified asthma, uncomplicated: Secondary | ICD-10-CM | POA: Insufficient documentation

## 2019-05-27 MED ORDER — AZITHROMYCIN 500 MG PO TABS
1000.0000 mg | ORAL_TABLET | Freq: Once | ORAL | Status: AC
  Administered 2019-05-27: 1000 mg via ORAL
  Filled 2019-05-27: qty 2

## 2019-05-27 MED ORDER — LIDOCAINE VISCOUS HCL 2 % MT SOLN
15.0000 mL | Freq: Once | OROMUCOSAL | Status: AC
  Administered 2019-05-27: 15 mL via OROMUCOSAL
  Filled 2019-05-27: qty 15

## 2019-05-27 MED ORDER — CEFTRIAXONE SODIUM 250 MG IJ SOLR
250.0000 mg | Freq: Once | INTRAMUSCULAR | Status: AC
  Administered 2019-05-27: 250 mg via INTRAMUSCULAR
  Filled 2019-05-27: qty 250

## 2019-05-27 NOTE — ED Triage Notes (Signed)
Pt presents via POV s/o sore throat x3 weeks. Reports called this am to come to ED due to positive Gonorrhea swab of throat.

## 2019-05-27 NOTE — ED Notes (Signed)
Pt states that he was called from [facility] and told he had gonorrhea and needed to be treated C/O sore throat

## 2019-05-27 NOTE — Discharge Instructions (Addendum)
You have been treated for a STD. You should avoid any sexual contact until your partners have been treated. Follow-up with the Health Department as needed.

## 2019-05-27 NOTE — ED Provider Notes (Addendum)
[facility] Emergency Department Provider Note ____________________________________________  Time seen: 0855  I have reviewed the triage vital signs and the nursing notes.  HISTORY  Chief Complaint  SEXUALLY TRANSMITTED DISEASE  HPI Rodney PaymentJesse L Farrier Jr. is a 28 y.o. male presents to the ED following a call about test results. He was confirmed positive for NG pharyngitis, after several months and encounters for sore throat. He presents now for treatment.   Past Medical History:  Diagnosis Date  . Asthma     There are no active problems to display for this patient.   History reviewed. No pertinent surgical history.  Prior to Admission medications   Medication Sig Start Date End Date Taking? Authorizing Provider  lidocaine (XYLOCAINE) 2 % solution Use as directed 15 mLs in the mouth or throat every 6 (six) hours as needed for mouth pain. 05/26/19   Faythe GheeFisher, Susan W, PA-C    Allergies Patient has no known allergies.  History reviewed. No pertinent family history.  Social History Social History   Tobacco Use  . Smoking status: Current Some Day Smoker    Types: Cigarettes  . Smokeless tobacco: Never Used  Substance Use Topics  . Alcohol use: Yes  . Drug use: No    Review of Systems  Constitutional: Negative for fever. Eyes: Negative for visual changes. ENT: Positive for sore throat. Cardiovascular: Negative for chest pain. Respiratory: Negative for shortness of breath. Gastrointestinal: Negative for abdominal pain, vomiting and diarrhea. Genitourinary: Negative for dysuria. Musculoskeletal: Negative for back pain. Skin: Negative for rash. Neurological: Negative for headaches, focal weakness or numbness. ____________________________________________  PHYSICAL EXAM:  VITAL SIGNS: ED Triage Vitals [05/27/19 0811]  Enc Vitals Group     BP (!) 159/97     Pulse Rate (!) 110     Resp 18     Temp 98.7 F (37.1 C)     Temp Source Oral     SpO2  96 %     Weight (!) 320 lb (145.2 kg)     Height      Head Circumference      Peak Flow      Pain Score 5     Pain Loc      Pain Edu?      Excl. in GC?     Constitutional: Alert and oriented. Well appearing and in no distress. Head: Normocephalic and atraumatic. Eyes: Conjunctivae are normal. Normal extraocular movements Mouth/Throat: Mucous membranes are moist. Uvula is midline and tonsils are enlarged and erythematous.  Neck: Supple. No thyromegaly. Hematological/Lymphatic/Immunological: No cervical lymphadenopathy. Cardiovascular: Normal rate, regular rhythm. Normal distal pulses. Respiratory: Normal respiratory effort.  Musculoskeletal: Nontender with normal range of motion in all extremities.  Neurologic:  Normal gait without ataxia. Normal speech and language. No gross focal neurologic deficits are appreciated. Skin:  Skin is warm, dry and intact. No rash noted. ____________________________________________  PROCEDURES  Procedures Azithromycin 1 g PO Rocephin 250 mg PO Lido 2% viscous PO ____________________________________________  INITIAL IMPRESSION / ASSESSMENT AND PLAN / ED COURSE  Rodney PaymentJesse L Feliciano Jr. was evaluated in Emergency Department on 05/27/2019 for the symptoms described in the history of present illness. He was evaluated in the context of the global COVID-19 pandemic, which necessitated consideration that the patient might be at risk for infection with the SARS-CoV-2 virus that causes COVID-19. Institutional protocols and algorithms that pertain to the evaluation of patients at risk for COVID-19 are in a state of rapid change based on information released  by regulatory bodies including the CDC and federal and state organizations. These policies and algorithms were followed during the patient's care in the ED.  Patient with ED evaluation and treatment for NG pharyngitis. He returned to the ED for treatment following evaluation and testing yesterday. He apparently did  not want to stay and await results at the time of testing. He has been treated appropriately and will follow-up with the [facility] as needed. He will avoid sexual contact until all partners are treated and symptom-free.  ____________________________________________  FINAL CLINICAL IMPRESSION(S) / ED DIAGNOSES  Final diagnoses:  Pharyngitis, gonococcal      Carmie End, Dannielle Karvonen, PA-C 05/27/19 0931    Melvenia Needles, PA-C 05/27/19 0931    Lilia Pro., MD 05/27/19 [phone]

## 2019-06-12 ENCOUNTER — Emergency Department
Admission: EM | Admit: 2019-06-12 | Discharge: 2019-06-12 | Disposition: A | Discharge status: Home / Self Care | Payer: Self-pay | Attending: Emergency Medicine | Admitting: Emergency Medicine

## 2019-06-12 ENCOUNTER — Emergency Department: Payer: Self-pay

## 2019-06-12 ENCOUNTER — Encounter: Payer: Self-pay | Admitting: Emergency Medicine

## 2019-06-12 ENCOUNTER — Other Ambulatory Visit: Payer: Self-pay

## 2019-06-12 DIAGNOSIS — M545 Low back pain, unspecified: Secondary | ICD-10-CM

## 2019-06-12 DIAGNOSIS — J45909 Unspecified asthma, uncomplicated: Secondary | ICD-10-CM | POA: Insufficient documentation

## 2019-06-12 DIAGNOSIS — F17210 Nicotine dependence, cigarettes, uncomplicated: Secondary | ICD-10-CM | POA: Insufficient documentation

## 2019-06-12 MED ORDER — PREDNISONE 20 MG PO TABS: 40.0000 mg | ORAL_TABLET | Freq: Every day | ORAL | 0 refills | Status: DC

## 2019-06-12 MED ORDER — IBUPROFEN 800 MG PO TABS
800.0000 mg | ORAL_TABLET | ORAL | Status: AC
  Administered 2019-06-12: 800 mg via ORAL
  Filled 2019-06-12: qty 1

## 2019-06-12 MED ORDER — PREDNISONE 20 MG PO TABS
40.0000 mg | ORAL_TABLET | Freq: Once | ORAL | Status: AC
  Administered 2019-06-12: 40 mg via ORAL
  Filled 2019-06-12: qty 2

## 2019-06-12 NOTE — ED Notes (Signed)
Patient tolerated oral meds well. Given saltines to buffer the ibuprofen.
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
## 2019-06-12 NOTE — ED Notes (Signed)
Patient with lower back pain after slipping on some steps and hitting his lower back against the edge of the steps. Patient with palpable soreness on both sides of spine. Patient denies striking his head and denies LOC. Patient also denies difficulty with urination. Able to ambulate without difficulty.
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
## 2019-11-20 IMAGING — CR LUMBAR SPINE - COMPLETE 4+ VIEW
1 series · 6 of 6 positions shown · non-contrast
Comparison: None.

CLINICAL DATA: Lower back pain after fall yesterday.

EXAM:
LUMBAR SPINE - COMPLETE 4+ VIEW

[Series 1: dg lumbar spine complete 4 +v · 0.14mm/px · 6 of 6 slices shown]
[im 1/6]
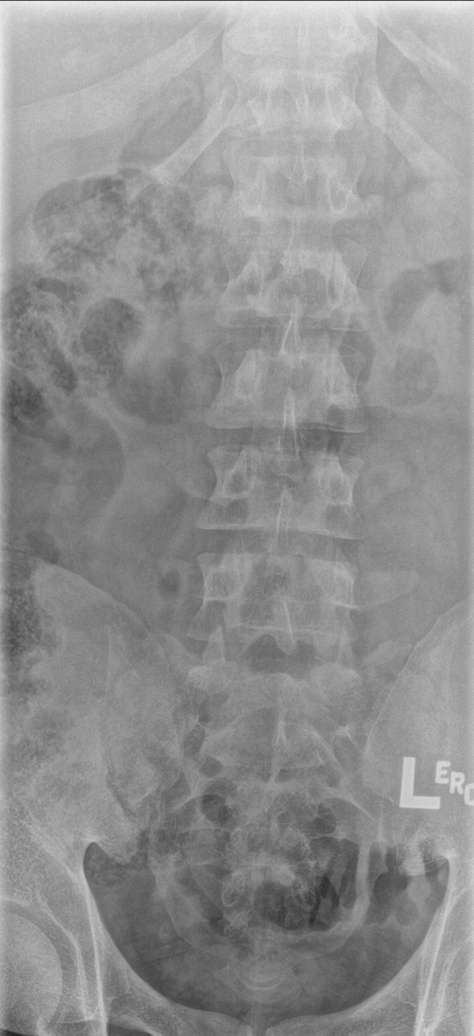
[im 2/6]
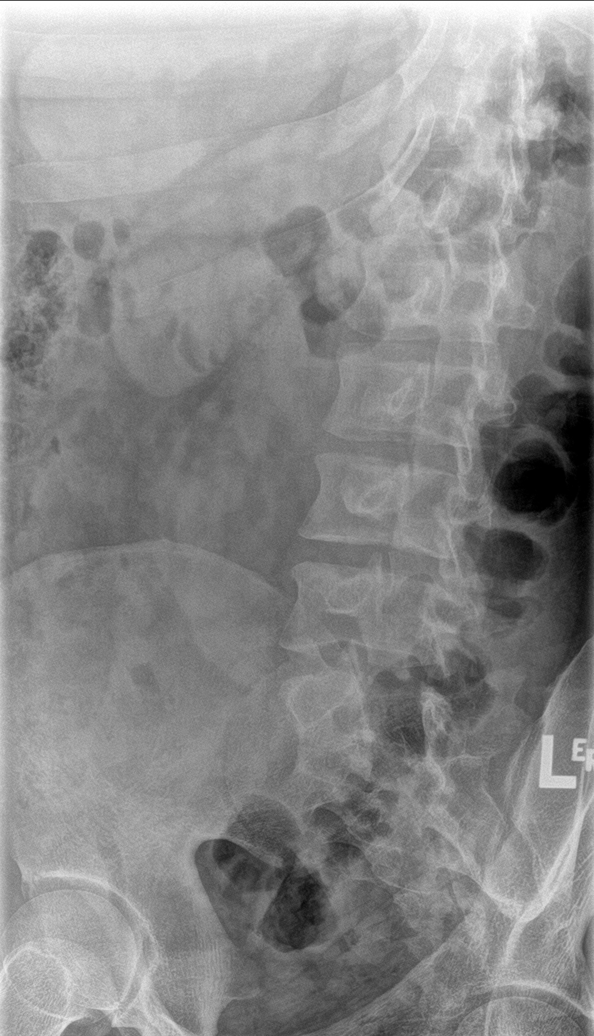
[im 3/6]
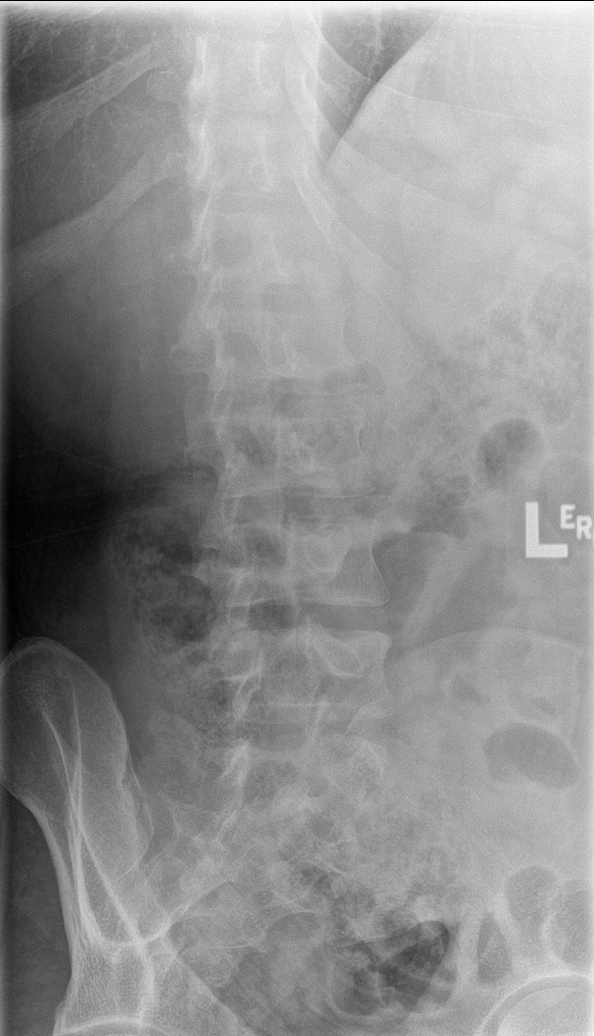
[im 4/6]
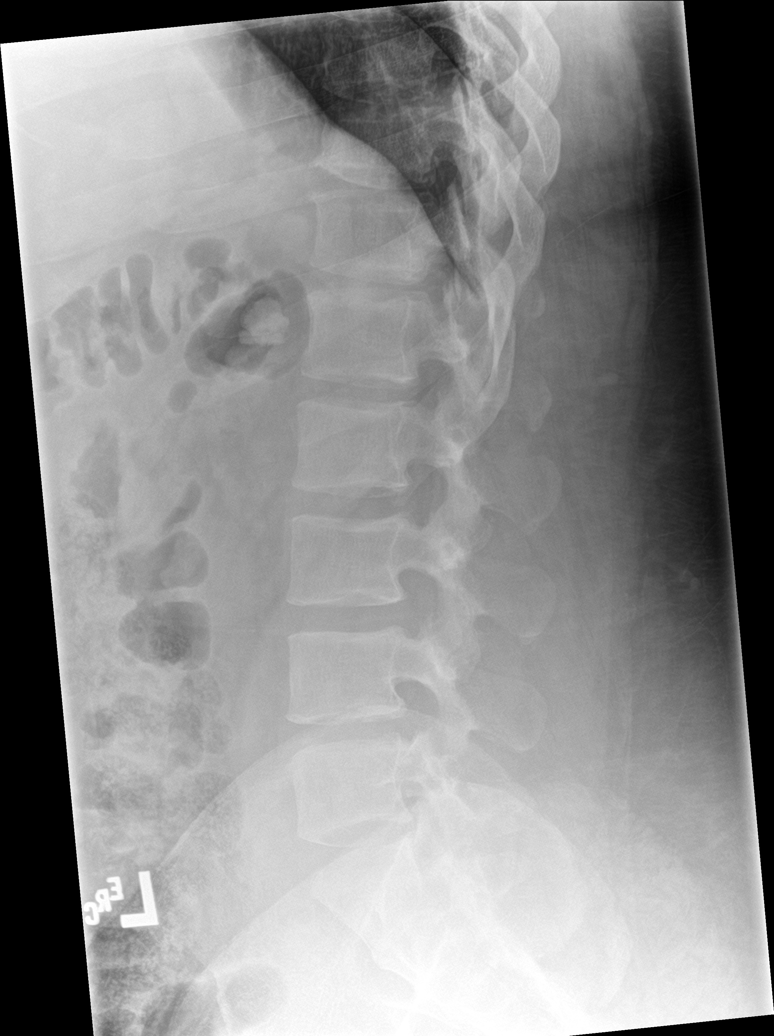
[im 5/6]
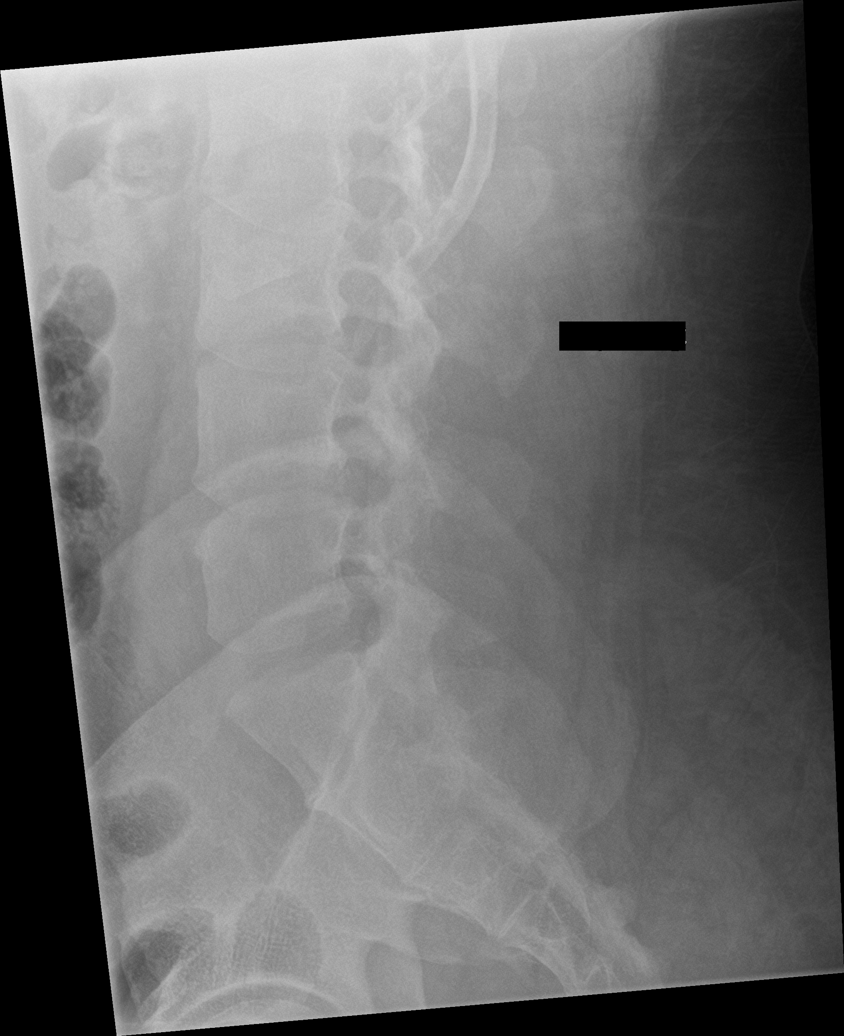
[im 6/6]
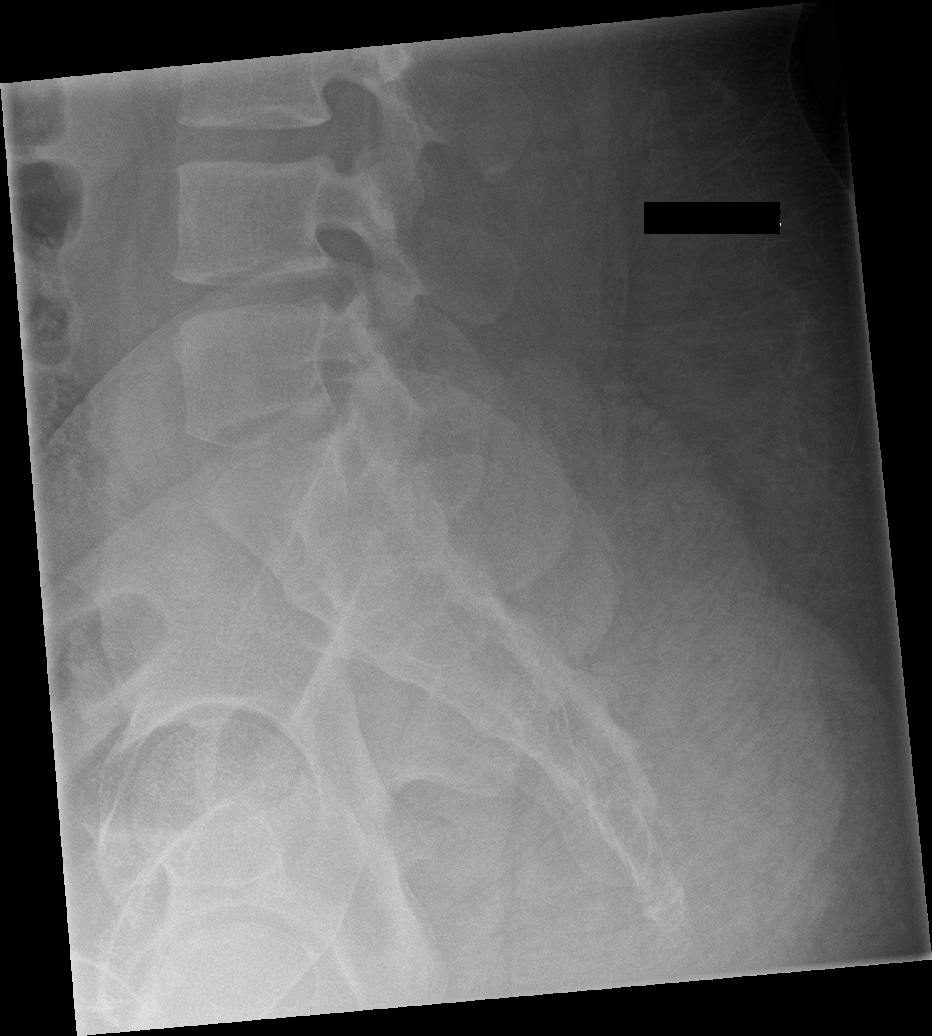

[6 of 6 positions shown; findings below may reference images not displayed]

FINDINGS: There is no evidence of lumbar spine fracture. Alignment is normal.
Intervertebral disc spaces are maintained. Mild T11-12 endplate
spurring.
IMPRESSION: No evidence of injury.

## 2020-06-12 ENCOUNTER — Encounter: Payer: Self-pay | Admitting: Emergency Medicine

## 2020-06-12 ENCOUNTER — Other Ambulatory Visit: Payer: Self-pay

## 2020-06-12 ENCOUNTER — Emergency Department
Admission: EM | Admit: 2020-06-12 | Discharge: 2020-06-12 | Disposition: A | Discharge status: Home / Self Care | Payer: Self-pay | Attending: Emergency Medicine | Admitting: Emergency Medicine

## 2020-06-12 DIAGNOSIS — E119 Type 2 diabetes mellitus without complications: Secondary | ICD-10-CM | POA: Insufficient documentation

## 2020-06-12 DIAGNOSIS — Z87891 Personal history of nicotine dependence: Secondary | ICD-10-CM | POA: Insufficient documentation

## 2020-06-12 DIAGNOSIS — Z7952 Long term (current) use of systemic steroids: Secondary | ICD-10-CM | POA: Insufficient documentation

## 2020-06-12 DIAGNOSIS — J45909 Unspecified asthma, uncomplicated: Secondary | ICD-10-CM | POA: Insufficient documentation

## 2020-06-12 DIAGNOSIS — R110 Nausea: Secondary | ICD-10-CM | POA: Insufficient documentation

## 2020-06-12 DIAGNOSIS — Z20822 Contact with and (suspected) exposure to covid-19: Secondary | ICD-10-CM | POA: Insufficient documentation

## 2020-06-12 LAB — URINALYSIS, COMPLETE (UACMP) WITH MICROSCOPIC
Bacteria, UA: NONE SEEN
Bilirubin Urine: NEGATIVE
Glucose, UA: >=500 mg/dL — AB
Hgb urine dipstick: NEGATIVE
Ketones, ur: NEGATIVE mg/dL
Leukocytes,Ua: NEGATIVE
Nitrite: NEGATIVE
Protein, ur: NEGATIVE mg/dL
Specific Gravity, Urine: 1.013 (ref 1.005–1.030)
Squamous Epithelial / HPF: NONE SEEN (ref 0–5)
pH: 6 (ref 5.0–8.0)

## 2020-06-12 LAB — LIPASE, BLOOD: Lipase: 31 U/L (ref 11–51)

## 2020-06-12 LAB — CBC WITH DIFFERENTIAL/PLATELET
Abs Immature Granulocytes: 0.06 10*3/uL (ref 0.00–0.07)
Basophils Absolute: 0 10*3/uL (ref 0.0–0.1)
Basophils Relative: 0 %
Eosinophils Absolute: 0.2 10*3/uL (ref 0.0–0.5)
Eosinophils Relative: 1 %
HCT: 43.9 % (ref 39.0–52.0)
Hemoglobin: 14.1 g/dL (ref 13.0–17.0)
Immature Granulocytes: 1 %
Lymphocytes Relative: 27 %
Lymphs Abs: 3.5 10*3/uL (ref 0.7–4.0)
MCH: 28.8 pg (ref 26.0–34.0)
MCHC: 32.1 g/dL (ref 30.0–36.0)
MCV: 89.6 fL (ref 80.0–100.0)
Monocytes Absolute: 0.7 10*3/uL (ref 0.1–1.0)
Monocytes Relative: 6 %
Neutro Abs: 8.4 10*3/uL — ABNORMAL HIGH (ref 1.7–7.7)
Neutrophils Relative %: 65 %
Platelets: 265 10*3/uL (ref 150–400)
RBC: 4.9 MIL/uL (ref 4.22–5.81)
RDW: 12.1 % (ref 11.5–15.5)
WBC: 12.9 10*3/uL — ABNORMAL HIGH (ref 4.0–10.5)
nRBC: 0 % (ref 0.0–0.2)

## 2020-06-12 LAB — COMPREHENSIVE METABOLIC PANEL
ALT: 36 U/L (ref 0–44)
AST: 32 U/L (ref 15–41)
Albumin: 4.2 g/dL (ref 3.5–5.0)
Alkaline Phosphatase: 105 U/L (ref 38–126)
Anion gap: 10 (ref 5–15)
BUN: 9 mg/dL (ref 6–20)
CO2: 25 mmol/L (ref 22–32)
Calcium: 9.2 mg/dL (ref 8.9–10.3)
Chloride: 99 mmol/L (ref 98–111)
Creatinine, Ser: 0.82 mg/dL (ref 0.61–1.24)
GFR calc Af Amer: >60 mL/min (ref >60)
GFR calc non Af Amer: >60 mL/min (ref >60)
Glucose, Bld: 244 mg/dL — ABNORMAL HIGH (ref 70–99)
Potassium: 3.9 mmol/L (ref 3.5–5.1)
Sodium: 134 mmol/L — ABNORMAL LOW (ref 135–145)
Total Bilirubin: 0.6 mg/dL (ref 0.3–1.2)
Total Protein: 7.7 g/dL (ref 6.5–8.1)

## 2020-06-12 LAB — SARS CORONAVIRUS 2 BY RT PCR (HOSPITAL ORDER, PERFORMED IN ~~LOC~~ HOSPITAL LAB): SARS Coronavirus 2: NEGATIVE

## 2020-06-12 MED ORDER — BLOOD GLUCOSE MONITOR KIT: PACK | 0 refills | Status: AC

## 2020-06-12 MED ORDER — METFORMIN HCL 500 MG PO TABS: 500.0000 mg | ORAL_TABLET | Freq: Two times a day (BID) | ORAL | 2 refills | Status: AC

## 2020-06-12 MED ORDER — SODIUM CHLORIDE 0.9 % IV BOLUS
1000.0000 mL | Freq: Once | INTRAVENOUS | Status: AC
  Administered 2020-06-12: 1000 mL via INTRAVENOUS

## 2020-06-12 MED ORDER — METFORMIN HCL 500 MG PO TABS
500.0000 mg | ORAL_TABLET | Freq: Once | ORAL | Status: AC
  Administered 2020-06-12: 500 mg via ORAL
  Filled 2020-06-12: qty 1

## 2020-06-12 MED ORDER — ONDANSETRON HCL 4 MG PO TABS: 4.0000 mg | ORAL_TABLET | Freq: Every day | ORAL | 0 refills | Status: DC | PRN

## 2020-06-12 MED ORDER — ONDANSETRON HCL 4 MG/2ML IJ SOLN
4.0000 mg | Freq: Once | INTRAMUSCULAR | Status: AC
  Administered 2020-06-12: 4 mg via INTRAVENOUS
  Filled 2020-06-12: qty 2

## 2020-06-12 NOTE — ED Provider Notes (Signed)
[facility] Emergency Department Provider Note  Time seen: 9:02 AM  I have reviewed the triage vital signs and the nursing notes.   HISTORY  Chief Complaint Nausea   HPI Rodney Jones. is a 29 y.o. male with a past medical history of asthma presents to the emergency department for nausea.  According to the patient over the past 2 to 3 days he has been experiencing significant nausea although states he is not vomiting.  Does state loose stool as well.  No abdominal pain or chest pain.  No fever cough congestion or shortness of breath.  In review of systems questioning the patient does note he has been extremely thirsty over the last few days and has been able to drink Gatorade but has not been eating much.    Past Medical History:  Diagnosis Date  . Asthma     There are no problems to display for this patient.   History reviewed. No pertinent surgical history.  Prior to Admission medications   Medication Sig Start Date End Date Taking? Authorizing Provider  lidocaine (XYLOCAINE) 2 % solution Use as directed 15 mLs in the mouth or throat every 6 (six) hours as needed for mouth pain. 05/26/19   Fisher, Roselyn Bering, PA-C  predniSONE (DELTASONE) 20 MG tablet Take 2 tablets (40 mg total) by mouth daily. 06/12/19   Sharyn Creamer, MD    No Known Allergies  No family history on file.  Social History Social History   Tobacco Use  . Smoking status: Former Smoker    Types: Cigarettes  . Smokeless tobacco: Never Used  Vaping Use  . Vaping Use: Some days  Substance Use Topics  . Alcohol use: Yes  . Drug use: No    Review of Systems Constitutional: Negative for fever Cardiovascular: Negative for chest pain. Respiratory: Negative for shortness of breath.  Negative for cough. Gastrointestinal: Negative for abdominal pain.  Positive for nausea but negative for vomiting. Genitourinary: Negative for urinary compaints Musculoskeletal: Negative for musculoskeletal  complaints Neurological: Negative for headache All other ROS negative  ____________________________________________   PHYSICAL EXAM:  VITAL SIGNS: ED Triage Vitals  Enc Vitals Group     BP 06/12/20 0636 (!) 165/100     Pulse Rate 06/12/20 0636 98     Resp 06/12/20 0636 18     Temp --      Temp Source 06/12/20 0636 Oral     SpO2 06/12/20 0636 97 %     Weight 06/12/20 0635 290 lb (131.5 kg)     Height 06/12/20 0635 5\' 8"  (1.727 m)     Head Circumference --      Peak Flow --      Pain Score 06/12/20 0635 0     Pain Loc --      Pain Edu? --      Excl. in GC? --    Constitutional: Alert and oriented. Well appearing and in no distress. Eyes: Normal exam ENT      Head: Normocephalic and atraumatic.      Mouth/Throat: Mucous membranes are moist. Cardiovascular: Normal rate, regular rhythm.  Respiratory: Normal respiratory effort without tachypnea nor retractions. Breath sounds are clear  Gastrointestinal: Soft and nontender. No distention.  Musculoskeletal: Nontender with normal range of motion in all extremities. Neurologic:  Normal speech and language. No gross focal neurologic deficits  Skin:  Skin is warm, dry and intact.  Psychiatric: Mood and affect are normal.   ____________________________________________  INITIAL IMPRESSION / ASSESSMENT AND PLAN / ED COURSE  Pertinent labs & imaging results that were available during my care of the patient were reviewed by me and considered in my medical decision making (see chart for details).   Patient presents to the emergency department for nausea over the past 2 to 3 days.  Patient's labs show an elevated blood glucose of 245, patient states he is not eating or drinking anything today.  Patient states a family history of diabetes but no personal history.  Urinalysis shows greater than 500 glucose in the urine as well likely indicating new onset diabetes.  Remainder the patient's lab work is largely nonrevealing.  We will check a  Covid swab as a precaution.  We will IV hydrate, dose Metformin as well as nausea medication.  Anticipate likely discharge home on Metformin and Zofran with PCP follow-up.  Patient agreeable to plan of care.  Deborha Payment. was evaluated in Emergency Department on 06/12/2020 for the symptoms described in the history of present illness. He was evaluated in the context of the global COVID-19 pandemic, which necessitated consideration that the patient might be at risk for infection with the SARS-CoV-2 virus that causes COVID-19. Institutional protocols and algorithms that pertain to the evaluation of patients at risk for COVID-19 are in a state of rapid change based on information released by regulatory bodies including the CDC and federal and state organizations. These policies and algorithms were followed during the patient's care in the ED.  ____________________________________________   FINAL CLINICAL IMPRESSION(S) / ED DIAGNOSES  Nausea New onset diabetes   Minna Antis, MD 06/12/20 [phone]

## 2020-06-12 NOTE — ED Triage Notes (Signed)
Patient ambulatory to triage with steady gait, without difficulty or distress noted; pt reports nausea x 2 days after eating chicken; denies pain, denies vomiting

## 2020-06-12 NOTE — ED Notes (Signed)
Pt to finish fluids per dr Lenard Lance

## 2020-06-12 NOTE — Discharge Instructions (Addendum)
As we discussed please begin taking your Metformin twice daily.  Please check your blood sugar once or twice daily before eating to ensure that your blood sugar is decreasing over time.  Please call the number provided to arrange a follow-up appointment with the primary care doctor to discuss further management as this will be an ongoing issue requiring medication going forward.  Return to the emergency department for any symptoms personally concerning to yourself.

## 2020-07-03 ENCOUNTER — Emergency Department
Admission: EM | Admit: 2020-07-03 | Discharge: 2020-07-03 | Disposition: A | Discharge status: Home / Self Care | Payer: Self-pay | Attending: Emergency Medicine | Admitting: Emergency Medicine

## 2020-07-03 ENCOUNTER — Other Ambulatory Visit: Payer: Self-pay

## 2020-07-03 ENCOUNTER — Encounter: Payer: Self-pay | Admitting: Emergency Medicine

## 2020-07-03 DIAGNOSIS — J45909 Unspecified asthma, uncomplicated: Secondary | ICD-10-CM | POA: Insufficient documentation

## 2020-07-03 DIAGNOSIS — A64 Unspecified sexually transmitted disease: Secondary | ICD-10-CM

## 2020-07-03 DIAGNOSIS — R300 Dysuria: Secondary | ICD-10-CM | POA: Insufficient documentation

## 2020-07-03 DIAGNOSIS — Z87891 Personal history of nicotine dependence: Secondary | ICD-10-CM | POA: Insufficient documentation

## 2020-07-03 DIAGNOSIS — Z202 Contact with and (suspected) exposure to infections with a predominantly sexual mode of transmission: Secondary | ICD-10-CM | POA: Insufficient documentation

## 2020-07-03 LAB — URINALYSIS, COMPLETE (UACMP) WITH MICROSCOPIC
Bacteria, UA: NONE SEEN
Bilirubin Urine: NEGATIVE
Glucose, UA: NEGATIVE mg/dL
Hgb urine dipstick: NEGATIVE
Ketones, ur: NEGATIVE mg/dL
Nitrite: NEGATIVE
Protein, ur: 100 mg/dL — AB
Specific Gravity, Urine: 1.035 — ABNORMAL HIGH (ref 1.005–1.030)
Squamous Epithelial / HPF: NONE SEEN (ref 0–5)
WBC, UA: >50 WBC/hpf — ABNORMAL HIGH (ref 0–5)
pH: 5 (ref 5.0–8.0)

## 2020-07-03 LAB — CHLAMYDIA/NGC RT PCR (ARMC ONLY)
Chlamydia Tr: NOT DETECTED
N gonorrhoeae: DETECTED — AB

## 2020-07-03 MED ORDER — AZITHROMYCIN 500 MG PO TABS
1000.0000 mg | ORAL_TABLET | Freq: Once | ORAL | Status: AC
  Administered 2020-07-03: 1000 mg via ORAL
  Filled 2020-07-03: qty 2

## 2020-07-03 MED ORDER — CEFTRIAXONE SODIUM 1 G IJ SOLR
500.0000 mg | Freq: Once | INTRAMUSCULAR | Status: AC
  Administered 2020-07-03: 500 mg via INTRAMUSCULAR
  Filled 2020-07-03: qty 10

## 2020-07-03 NOTE — ED Triage Notes (Signed)
Presents with penile discharge  States hx of GC
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
## 2020-07-03 NOTE — Discharge Instructions (Signed)
Wear condoms while having sex. Do not have sex for 10 days When you do return to having sexual activity please wear condoms to prevent STDs Notify any partners that you have an STD.  Your test results will be back in approximately 24 hours.  You can see these on MyChart Follow-up with [facility] department for HIV and syphilis testing.  If your STD testing here is positive you should have these tests done. You have been treated for gonorrhea and chlamydia today.  However some strains of gonorrhea chlamydia are resistant to treatment.  If you feel that your symptoms are not improving within 3 to 5 days he should call for an appointment at the health department.

## 2020-07-08 ENCOUNTER — Telehealth: Payer: Self-pay | Admitting: Emergency Medicine

## 2020-07-08 NOTE — Telephone Encounter (Signed)
Called patient to assure he is aware of std results.  Left message asking him to call me.

## 2020-11-01 ENCOUNTER — Other Ambulatory Visit: Payer: Self-pay

## 2020-11-01 ENCOUNTER — Emergency Department
Admission: EM | Admit: 2020-11-01 | Discharge: 2020-11-01 | Disposition: A | Discharge status: Home / Self Care | Payer: Self-pay | Attending: Emergency Medicine | Admitting: Emergency Medicine

## 2020-11-01 DIAGNOSIS — Z5321 Procedure and treatment not carried out due to patient leaving prior to being seen by health care provider: Secondary | ICD-10-CM | POA: Insufficient documentation

## 2020-11-01 DIAGNOSIS — M545 Low back pain, unspecified: Secondary | ICD-10-CM | POA: Insufficient documentation

## 2020-11-01 DIAGNOSIS — G43909 Migraine, unspecified, not intractable, without status migrainosus: Secondary | ICD-10-CM | POA: Insufficient documentation
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
## 2021-08-23 ENCOUNTER — Emergency Department: Payer: Self-pay

## 2021-08-23 ENCOUNTER — Other Ambulatory Visit: Payer: Self-pay

## 2021-08-23 ENCOUNTER — Emergency Department
Admission: EM | Admit: 2021-08-23 | Discharge: 2021-08-23 | Disposition: A | Discharge status: Home / Self Care | Payer: Self-pay | Attending: Emergency Medicine | Admitting: Emergency Medicine

## 2021-08-23 DIAGNOSIS — X501XXA Overexertion from prolonged static or awkward postures, initial encounter: Secondary | ICD-10-CM | POA: Insufficient documentation

## 2021-08-23 DIAGNOSIS — Z87891 Personal history of nicotine dependence: Secondary | ICD-10-CM | POA: Insufficient documentation

## 2021-08-23 DIAGNOSIS — S93401A Sprain of unspecified ligament of right ankle, initial encounter: Secondary | ICD-10-CM

## 2021-08-23 DIAGNOSIS — Z7984 Long term (current) use of oral hypoglycemic drugs: Secondary | ICD-10-CM | POA: Insufficient documentation

## 2021-08-23 DIAGNOSIS — J45909 Unspecified asthma, uncomplicated: Secondary | ICD-10-CM | POA: Insufficient documentation

## 2021-08-23 DIAGNOSIS — I10 Essential (primary) hypertension: Secondary | ICD-10-CM

## 2021-08-23 DIAGNOSIS — E119 Type 2 diabetes mellitus without complications: Secondary | ICD-10-CM | POA: Insufficient documentation

## 2021-08-23 NOTE — ED Provider Notes (Signed)
Emergency Medicine Provider Triage Evaluation Note  Rodney Jones. , a 30 y.o. male  was evaluated in triage.  Pt complains of right ankle.  Patient fell 1 month ago and was not seen at that time.  He again fell a week and a half ago with additional injury to the same ankle.  Has been taking Tylenol and ibuprofen without relief.  Review of Systems  Positive: No prior fractures, no other complaints. Negative:   Physical Exam  BP (!) 147/98 (BP Location: Left Arm)   Pulse 84   Temp 98.6 F (37 C) (Oral)   Resp 16   Ht 5\' 8"  (1.727 m)   Wt 124.7 kg   SpO2 98%   BMI 41.81 kg/m  Gen:   Awake, no distress.  Walks with slight limp. Resp:  Normal effort  MSK:   Moves extremities without difficulty.  Moderate tenderness to palpation right lateral malleolus without obvious deformity.  Patient has brace on currently.  Motor sensory function intact distally. Other:    Medical Decision Making  Medically screening exam initiated at 7:09 AM.  Appropriate orders placed.  . was informed that the remainder of the evaluation will be completed by another provider, this initial triage assessment does not replace that evaluation, and the importance of remaining in the ED until their evaluation is complete.     Rodney Payment, PA-C 08/23/21 13/05/22    [id number], MD 08/23/21 [phone]

## 2021-08-23 NOTE — ED Provider Notes (Signed)
[facility] Emergency Department Provider Note  ____________________________________________   Event Date/Time   First MD Initiated Contact with Patient 08/23/21 [phone]     (approximate)  I have reviewed the triage vital signs and the nursing notes.   HISTORY  Chief Complaint Ankle Pain (right)   HPI Rodney Jones. is a 30 y.o. male with a past medical history of asthma and diabetes who presents for assessment of right ankle pain.  Patient states he rolled it about a month ago and then about a week and a half ago and has had some persistent pain on the top and side part of the ankle.  He has not had any other injuries that he recalls, knee pain, left lower extremity pain or any other associated sick symptoms including fevers, chills, cough, nausea, vomiting, diarrhea, burning with urination, rash or any other acute sick symptoms.  No illicit drug use.  No other acute concerns at this time.         Past Medical History:  Diagnosis Date   Asthma     There are no problems to display for this patient.   History reviewed. No pertinent surgical history.  Prior to Admission medications   Medication Sig Start Date End Date Taking? Authorizing Provider  blood glucose meter kit and supplies KIT Dispense based on patient and insurance preference. Use up to four times daily as directed. (FOR ICD-9 250.00, 250.01). 06/12/20   Harvest Dark, MD  metFORMIN (GLUCOPHAGE) 500 MG tablet Take 1 tablet (500 mg total) by mouth 2 (two) times daily with a meal. 06/12/20 06/12/21  Harvest Dark, MD    Allergies Patient has no known allergies.  History reviewed. No pertinent family history.  Social History Social History   Tobacco Use   Smoking status: Former    Types: Cigarettes   Smokeless tobacco: Never  Vaping Use   Vaping Use: Some days  Substance Use Topics   Alcohol use: Yes   Drug use: No    Review of Systems  Review of Systems   Constitutional:  Negative for chills and fever.  HENT:  Negative for sore throat.   Eyes:  Negative for pain.  Respiratory:  Negative for cough and stridor.   Cardiovascular:  Negative for chest pain.  Gastrointestinal:  Negative for vomiting.  Genitourinary:  Negative for dysuria.  Musculoskeletal:  Positive for joint pain (R ankle).  Skin:  Negative for rash.  Neurological:  Negative for seizures, loss of consciousness and headaches.  Psychiatric/Behavioral:  Negative for suicidal ideas.   All other systems reviewed and are negative.    ____________________________________________   PHYSICAL EXAM:  VITAL SIGNS: ED Triage Vitals  Enc Vitals Group     BP 08/23/21 0633 (!) 147/98     Pulse Rate 08/23/21 0633 84     Resp 08/23/21 0633 16     Temp 08/23/21 0633 98.6 F (37 C)     Temp Source 08/23/21 0633 Oral     SpO2 08/23/21 0633 98 %     Weight 08/23/21 0629 275 lb (124.7 kg)     Height 08/23/21 0629 _0  (1.727 m)     Head Circumference --      Peak Flow --      Pain Score 08/23/21 0629 10     Pain Loc --      Pain Edu? --      Excl. in [place]? --    Vitals:   08/23/21 [phone]  BP: (!) 147/98  Pulse: 84  Resp: 16  Temp: 98.6 F (37 C)  SpO2: 98%   Physical Exam Vitals and nursing note reviewed.  Constitutional:      Appearance: He is well-developed.  HENT:     Head: Normocephalic and atraumatic.     Right Ear: External ear normal.     Left Ear: External ear normal.     Nose: Nose normal.  Eyes:     Conjunctiva/sclera: Conjunctivae normal.  Cardiovascular:     Rate and Rhythm: Normal rate and regular rhythm.     Heart sounds: No murmur heard. Pulmonary:     Effort: Pulmonary effort is normal. No respiratory distress.     Breath sounds: Normal breath sounds.  Abdominal:     General: There is no distension.     Palpations: Abdomen is soft.  Musculoskeletal:     Cervical back: Neck supple.  Skin:    General: Skin is warm and dry.     Capillary Refill:  Capillary refill takes less than 2 seconds.  Neurological:     Mental Status: He is alert and oriented to person, place, and time.  Psychiatric:        Mood and Affect: Mood normal.    There is some mild edema over the ATFL ligament on the right ankle.  There is no joint effusion patient able to move his toes and slightly plantar dorsiflex although with decreased during compared to the left secondary to some discomfort.  No significant erythema there is some mild edema over the ATFL as well.  Medial malleolus and plantar aspect of the foot are unremarkable and nontender.  2+ DP pulse.  Calf is unremarkable. ____________________________________________   LABS (all labs ordered are listed, but only abnormal results are displayed)  Labs Reviewed - No data to display ____________________________________________  EKG  ____________________________________________  RADIOLOGY  ED MD interpretation: Ankle and the right ankle shows no acute fracture dislocation but small calcaneal spur.  Official radiology report(s): DG Ankle Complete Right  Result Date: 08/23/2021 CLINICAL DATA:  Right ankle injury 1 month prior with recurrent injury and pain EXAM: RIGHT ANKLE - COMPLETE 3+ VIEW COMPARISON:  None. FINDINGS: No fracture or subluxation. Small plantar right calcaneal spur. No focal osseous lesions. No radiopaque foreign bodies. IMPRESSION: No fracture or subluxation. Small plantar right calcaneal spur. Electronically Signed   By: Ilona Sorrel M.D.   On: 08/23/2021 08:00    ____________________________________________   PROCEDURES  Procedure(s) performed (including Critical Care):  Procedures   ____________________________________________   INITIAL IMPRESSION / ASSESSMENT AND PLAN / ED COURSE      Patient presents with above-stated history exam for assessment of some right superior and lateral ankle discomfort after 2 mechanical falls with patient describing inversion injury both  times.  On arrival he is labor tensive otherwise stable vital signs on room air.  He is taking Tylenol ibuprofen but underdosing his ibuprofen and taking 200 mg at a time.  On exam he does have some edema and tenderness at the ATFL without large effusion or overlying skin changes.  There is no significant findings on the medial malleolus or plantar aspect of the foot.  Suspect likely sprain.  At this time I have a low suspicion for a cellulitis, septic joint, DVT, or compartment syndrome..  Plain film today shows no acute fracture or dislocation.  Will place patient in cam walking boot and have him follow-up with Ortho.  Discussed taking recommended dose of a  Profen at 400 mg every 6 hours and 1 g Tylenol every 6 hours.  Also advised to follow-up his elevated blood pressure today with PCP.  Discharged in stable condition.  Strict return precautions advised and discussed.       ____________________________________________   FINAL CLINICAL IMPRESSION(S) / ED DIAGNOSES  Final diagnoses:  Sprain of right ankle, unspecified ligament, initial encounter  Hypertension, unspecified type    Medications - No data to display   ED Discharge Orders     None        Note:  This document was prepared using Dragon voice recognition software and may include unintentional dictation errors.    Lucrezia Starch, MD 08/23/21 [phone]

## 2021-08-23 NOTE — ED Triage Notes (Signed)
Pt arrived via pov ambulatory with limp to triage. Pt reports injury one month ago without xrays taken at that time. Pt states pain increased last night. NAD noted at this time

## 2021-08-23 NOTE — ED Notes (Signed)
Pt verbalizes understanding of d/c instructions, medications and follow up 

## 2021-11-03 ENCOUNTER — Other Ambulatory Visit: Payer: Self-pay

## 2021-11-03 ENCOUNTER — Encounter: Payer: Self-pay | Admitting: Emergency Medicine

## 2021-11-03 ENCOUNTER — Emergency Department: Payer: Self-pay

## 2021-11-03 ENCOUNTER — Emergency Department
Admission: EM | Admit: 2021-11-03 | Discharge: 2021-11-03 | Disposition: A | Discharge status: Home / Self Care | Payer: Self-pay | Attending: Emergency Medicine | Admitting: Emergency Medicine

## 2021-11-03 DIAGNOSIS — L03811 Cellulitis of head [any part, except face]: Secondary | ICD-10-CM | POA: Insufficient documentation

## 2021-11-03 DIAGNOSIS — M542 Cervicalgia: Secondary | ICD-10-CM | POA: Insufficient documentation

## 2021-11-03 MED ORDER — DOXYCYCLINE HYCLATE 50 MG PO CAPS: 100.0000 mg | ORAL_CAPSULE | Freq: Two times a day (BID) | ORAL | 0 refills | Status: AC

## 2021-11-03 MED ORDER — IOHEXOL 300 MG/ML  SOLN
75.0000 mL | Freq: Once | INTRAMUSCULAR | Status: DC | PRN
  Filled 2021-11-03: qty 75

## 2021-11-03 MED ORDER — HYDROCODONE-ACETAMINOPHEN 5-325 MG PO TABS: 1.0000 | ORAL_TABLET | ORAL | 0 refills | Status: DC | PRN

## 2021-11-03 MED ORDER — KETOROLAC TROMETHAMINE 30 MG/ML IJ SOLN
30.0000 mg | Freq: Once | INTRAMUSCULAR | Status: AC
  Administered 2021-11-03: 30 mg via INTRAVENOUS
  Filled 2021-11-03: qty 1

## 2021-11-03 MED ORDER — IOHEXOL 350 MG/ML SOLN
75.0000 mL | Freq: Once | INTRAVENOUS | Status: AC | PRN
  Administered 2021-11-03: 75 mL via INTRAVENOUS
  Filled 2021-11-03: qty 75

## 2021-11-03 NOTE — ED Provider Notes (Signed)
[facility] Provider Note    Event Date/Time   First MD Initiated Contact with Patient 11/03/21 [phone]     (approximate)   History   Neck Pain   HPI Rodney Jones. is a 31 y.o. male who presents for knots to the left lateral neck as well as the occipital scalp that both began approximately 2 days ago and have been aching since then.  Patient states that any palpitation of these areas worsens this pain with the scalp mass being more painful than the left neck.  Patient does state that the left neck mass has decreased in size somewhat but the posterior scalp mass has not.  States that he cannot sleep due to this pain.  Patient denies any fevers, vision changes, difficulty swallowing, difficulty breathing, or history of malignancy     Physical Exam   Triage Vital Signs: ED Triage Vitals  Enc Vitals Group     BP 11/03/21 0618 (!) 156/93     Pulse Rate 11/03/21 0618 (!) 103     Resp 11/03/21 0618 18     Temp 11/03/21 0618 98.1 F (36.7 C)     Temp Source 11/03/21 0618 Oral     SpO2 11/03/21 0618 97 %     Weight 11/03/21 0617 280 lb (127 kg)     Height 11/03/21 0617 5\' 8"  (1.727 m)     Head Circumference --      Peak Flow --      Pain Score 11/03/21 0617 9     Pain Loc --      Pain Edu? --      Excl. in [place]? --     Most recent vital signs: Vitals:   11/03/21 0618  BP: (!) 156/93  Pulse: (!) 103  Resp: 18  Temp: 98.1 F (36.7 C)  SpO2: 97%    General: Awake, no distress.  CV:  Good peripheral perfusion.  Resp:  Normal effort.  Abd:  No distention.  Other:  Palpable approximately 3 cm mass to the left lateral neck just posterior to the SCM that is tender to palpation.  Approximately 3 cm diameter mass appreciated to the right aspect of the occipital scalp skin fold.  Patient is an obese African-American male in no distress on stretcher   ED Results / Procedures / Treatments   Labs (all labs ordered are listed, but only abnormal results  are displayed) Labs Reviewed - No data to display  RADIOLOGY ED MD interpretation: CT soft tissue of the next does show 2.1 cm peripherally enhancing collection in the midline suboccipital soft tissues with surrounding inflammatory changes likely reflecting a small abscess.  Patient also has scattered prominent anterior cervical lymph nodes measuring up to 1.1 cm that are likely reactive  Official radiology report(s): CT Soft Tissue Neck W Contrast  Result Date: 11/03/2021 CLINICAL DATA:  Left-sided neck mass posterior to the SCM, and lower occipital mass, painful EXAM: CT NECK WITH CONTRAST TECHNIQUE: Multidetector CT imaging of the neck was performed using the standard protocol following the bolus administration of intravenous contrast. RADIATION DOSE REDUCTION: This exam was performed according to the departmental dose-optimization program which includes automated exposure control, adjustment of the mA and/or kV according to patient size and/or use of iterative reconstruction technique. CONTRAST:  12mL OMNIPAQUE IOHEXOL 350 MG/ML SOLN COMPARISON:  None. FINDINGS: Pharynx and larynx: The nasal cavity and nasopharynx are unremarkable. The oral cavity and oropharynx are unremarkable. The parapharyngeal spaces are clear.  The hypopharynx and larynx are unremarkable. The vocal folds are normal in appearance. Salivary glands: The parotid and submandibular glands are unremarkable. Thyroid: Unremarkable. Lymph nodes: There are scattered prominent cervical chain lymph nodes bilaterally. The largest measures up to 1.1 cm on the left at level IIB which may correspond to the clinically palpable abnormality. Vascular: Unremarkable. Limited intracranial: The imaged portions of the intracranial compartment are unremarkable. Visualized orbits: The imaged globes and orbits are unremarkable. Mastoids and visualized paranasal sinuses: There is mild mucosal thickening along the floors of the maxillary sinuses. The mastoid  air cells are clear. Skeleton: There is no acute osseous abnormality or aggressive osseous lesion. Upper chest: The imaged lung apices are clear. Other: There is a 2.1 cm AP x 2.0 cm TV by 1.5 cm cc peripherally enhancing lesion in the midline suboccipital soft tissues. There is surrounding inflammatory fat stranding. IMPRESSION: 1. 2.1 cm peripherally enhancing collection in the midline suboccipital soft tissues with surrounding inflammatory change most likely reflects an abscess. 2. Scattered prominent cervical chain lymph nodes measuring up to 1.1 cm on the left are likely reactive. This lymph node likely corresponds to the clinically palpable abnormality as it underlies the radiopaque marker. As this lesion is palpable, it may be followed clinically. Electronically Signed   By: Lesia Hausen M.D.   On: 11/03/2021 10:52      PROCEDURES:  Critical Care performed: No  Procedures   MEDICATIONS ORDERED IN ED: Medications  ketorolac (TORADOL) 30 MG/ML injection 30 mg (30 mg Intravenous Given 11/03/21 0851)  iohexol (OMNIPAQUE) 350 MG/ML injection 75 mL (75 mLs Intravenous Contrast Given 11/03/21 1028)     IMPRESSION / MDM / ASSESSMENT AND PLAN / ED COURSE  I reviewed the triage vital signs and the nursing notes.                              Differential diagnosis includes, but is not limited to, abscess, cellulitis, osteomyelitis  Presentation most consistent with simple cellulitis and possible developing abscess that is quite deep. Given History, Exam, and Workup I have low suspicion for Necrotizing Fasciitis, Abscess, Osteomyelitis, DVT or other emergent problem as a cause for this presentation. CT scan that shows what is listed above.  I spoke to patient at length as far as options for treatment including likely inpatient surgery and/or antibiotic therapy.  After discussion, patient decided that he would like to pursue outpatient antibiotic therapy prior to any surgical treatments.  Patient  was encouraged to return if symptoms worsen Rx: Doxycycline 100 mg twice daily x5 days  Disposition: Discharge. No evidence of serious bacterial illness. Nontoxic appearing, VSS. Low risk for treatment failure based on history. Strict return precautions discussed with patient with full understanding. Advised patient to follow up promptly with primary care provider within next 48 hours.      FINAL CLINICAL IMPRESSION(S) / ED DIAGNOSES   Final diagnoses:  Cellulitis of head except face     Rx / DC Orders   ED Discharge Orders          Ordered    doxycycline (VIBRAMYCIN) 50 MG capsule  2 times daily        11/03/21 1105    HYDROcodone-acetaminophen (NORCO) 5-325 MG tablet  Every 4 hours PRN        11/03/21 1105             Note:  This document was prepared using Dragon  voice recognition software and may include unintentional dictation errors.   Merwyn KatosBradler, Ryleah Miramontes K, MD 11/03/21 1242

## 2021-11-03 NOTE — ED Triage Notes (Signed)
Pt c/o a knot on the left side of his neck and on the back of his head that is causing him pain. Pt states he has been unable to sleep. States that he can turn his head to the left, but it is harder to turn his head to the right or look up.

## 2022-01-31 IMAGING — CR DG ANKLE COMPLETE 3+V*R*
1 series · 3 of 3 positions shown · non-contrast
Comparison: None.

CLINICAL DATA: Right ankle injury 1 month prior with recurrent
injury and pain

EXAM:
RIGHT ANKLE - COMPLETE 3+ VIEW

[Series 1: dg ankle complete right · 0.14mm/px · 3 of 3 slices shown]
[im 1/3]
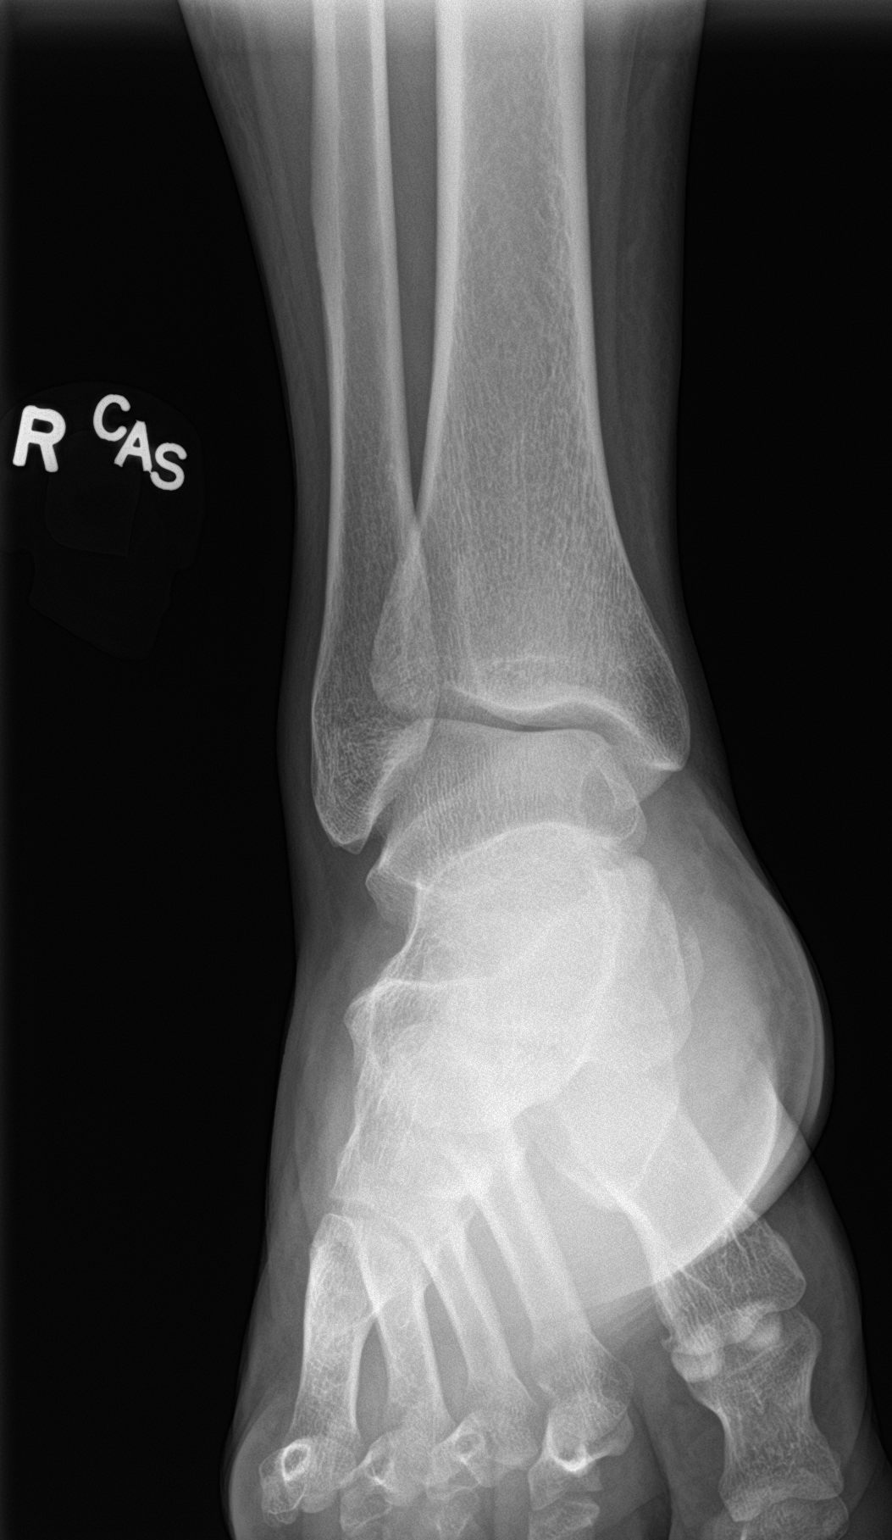
[im 2/3]
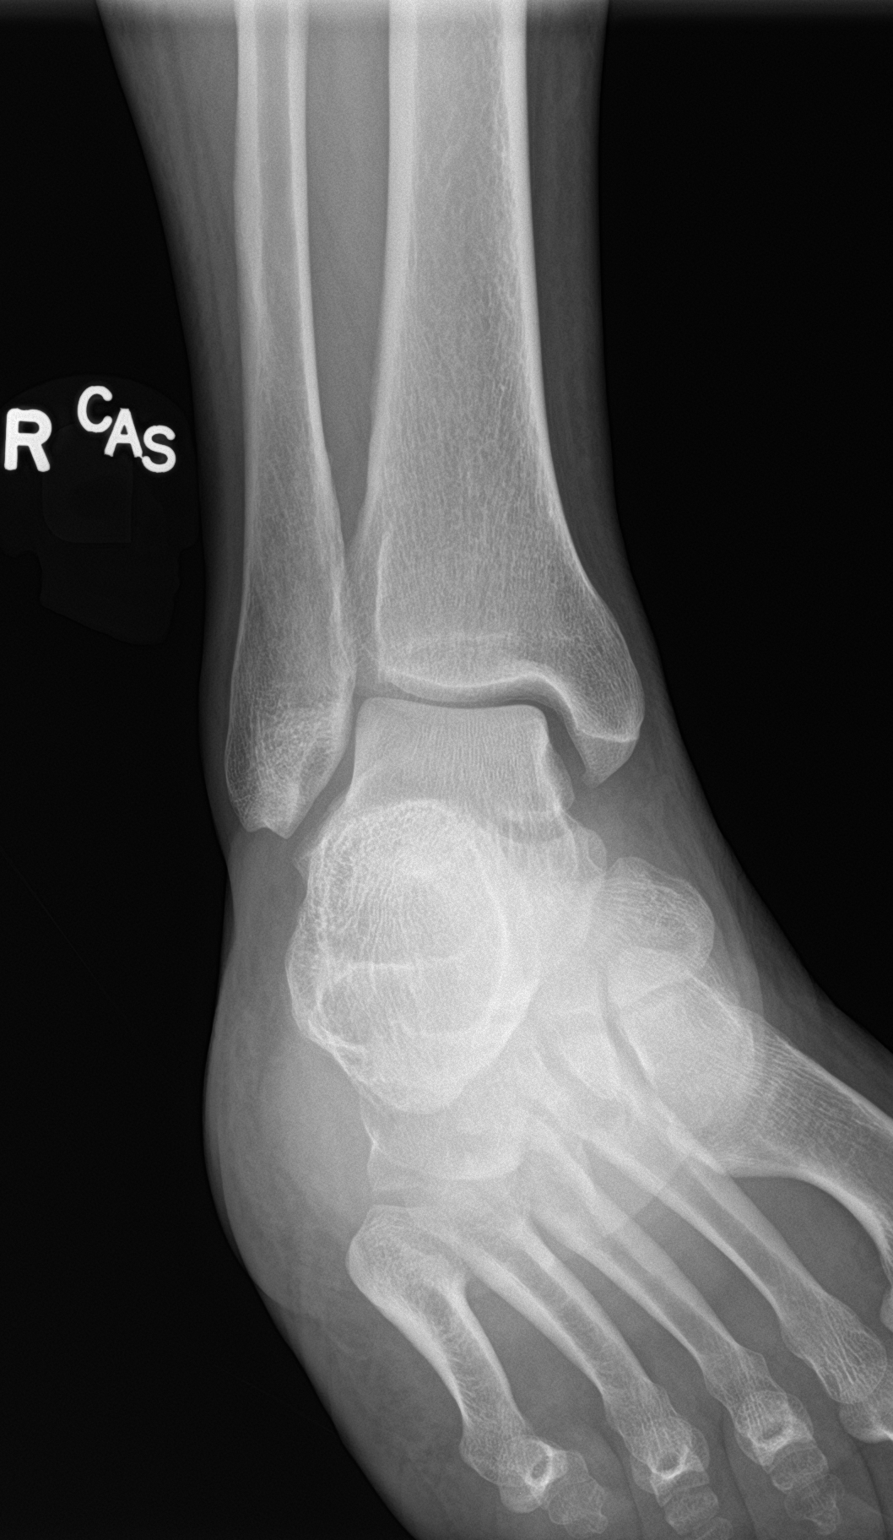
[im 3/3]
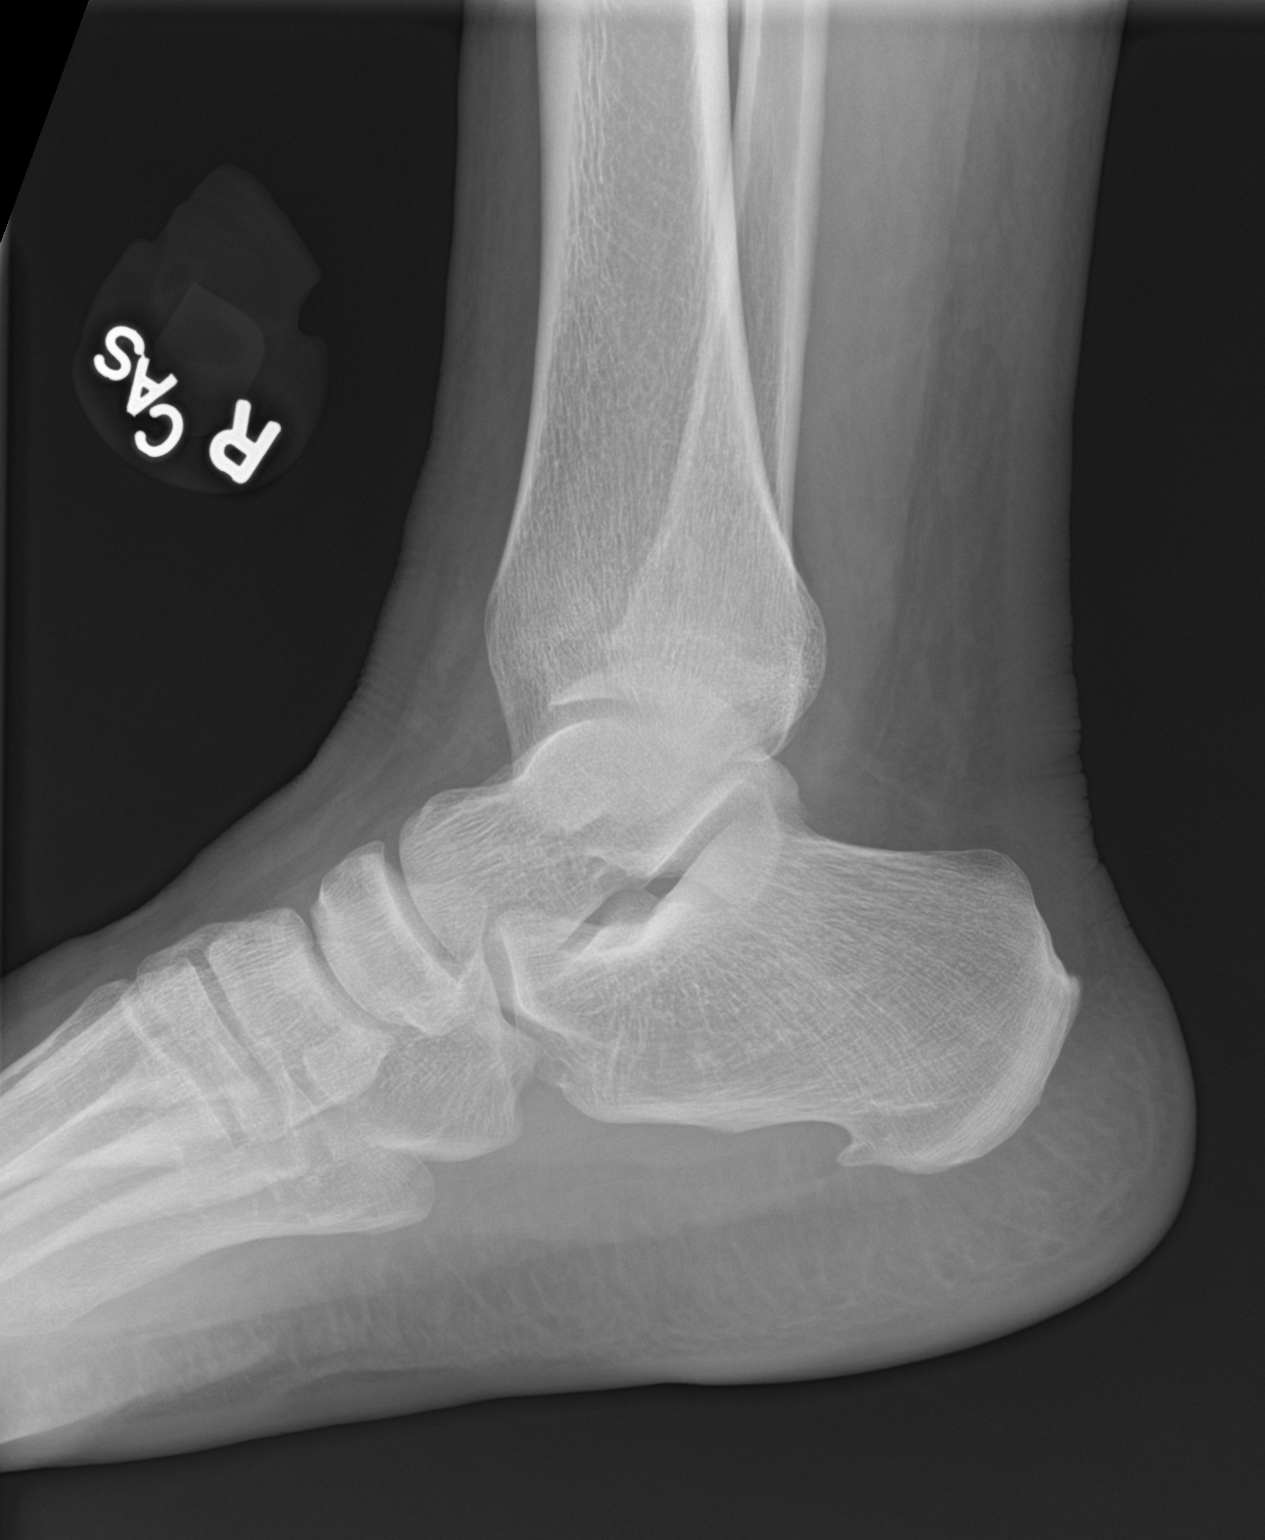

[3 of 3 positions shown; findings below may reference images not displayed]

FINDINGS: No fracture or subluxation. Small plantar right calcaneal spur. No
focal osseous lesions. No radiopaque foreign bodies.
IMPRESSION: No fracture or subluxation. Small plantar right calcaneal spur.

## 2022-04-08 ENCOUNTER — Other Ambulatory Visit: Payer: Self-pay

## 2022-04-08 ENCOUNTER — Emergency Department
Admission: EM | Admit: 2022-04-08 | Discharge: 2022-04-08 | Disposition: A | Discharge status: Home / Self Care | Payer: Self-pay | Attending: Emergency Medicine | Admitting: Emergency Medicine

## 2022-04-08 DIAGNOSIS — L02811 Cutaneous abscess of head [any part, except face]: Secondary | ICD-10-CM | POA: Insufficient documentation

## 2022-04-08 DIAGNOSIS — R030 Elevated blood-pressure reading, without diagnosis of hypertension: Secondary | ICD-10-CM | POA: Insufficient documentation

## 2022-04-08 MED ORDER — OXYCODONE HCL 5 MG PO TABS: 5.0000 mg | ORAL_TABLET | Freq: Four times a day (QID) | ORAL | 0 refills | Status: DC | PRN

## 2022-04-08 MED ORDER — CLINDAMYCIN HCL 300 MG PO CAPS: 300.0000 mg | ORAL_CAPSULE | Freq: Three times a day (TID) | ORAL | 0 refills | Status: AC

## 2022-04-08 NOTE — ED Notes (Signed)
Provider evaluated pt at bedside. °

## 2022-04-08 NOTE — Discharge Instructions (Signed)
Follow-up with your primary care provider or one of the dermatologist listed on your discharge papers for further evaluation of the abscesses that you develop on your scalp to see if there is any way to prevent these.  Begin taking the clindamycin until completely finished and the pain medication is only if needed for pain.  Do not drive or operate machinery while taking these medications.  Also warm moist compresses to these areas may decrease the amount of time that it takes to heal.

## 2022-04-08 NOTE — ED Triage Notes (Signed)
Pt in with co abscesses to scalp, hx of the same was put on antibiotics.

## 2022-04-08 NOTE — ED Provider Notes (Signed)
[facility] Provider Note    Event Date/Time   First MD Initiated Contact with Patient 04/08/22 [phone]     (approximate)   History   Abscess   HPI  Rodney Jones. is a 31 y.o. male   resents to the ED with complaint of possible abscess to his scalp.  Patient states that he has been seen before for similar symptoms and was placed on antibiotics which completely resolved his issues.  Patient denies any fever or chills.  Patient's blood pressure was elevated in the emergency department but states he has never been told he has hypertension.  There is a positive family history.      Physical Exam   Triage Vital Signs: ED Triage Vitals  Enc Vitals Group     BP 04/08/22 0708 (!) 169/105     Pulse Rate 04/08/22 0708 91     Resp 04/08/22 0708 18     Temp 04/08/22 0708 98.4 F (36.9 C)     Temp Source 04/08/22 0708 Oral     SpO2 04/08/22 0708 99 %     Weight 04/08/22 0706 (!) 345 lb (156.5 kg)     Height 04/08/22 0706 5\' 8"  (1.727 m)     Head Circumference --      Peak Flow --      Pain Score 04/08/22 0706 10     Pain Loc --      Pain Edu? --      Excl. in GC? --     Most recent vital signs: Vitals:   04/08/22 0708 04/08/22 0745  BP: (!) 169/105 (!) 142/74  Pulse: 91   Resp: 18   Temp: 98.4 F (36.9 C)   SpO2: 99%      General: Awake, no distress.  CV:  Good peripheral perfusion.  Resp:  Normal effort.  Abd:  No distention.  Other:  Left lateral scalp area has 2 specific areas that are tender to palpation with minimal erythema at this time.  No active drainage present.   ED Results / Procedures / Treatments   Labs (all labs ordered are listed, but only abnormal results are displayed) Labs Reviewed - No data to display     PROCEDURES:  Critical Care performed:   Procedures   MEDICATIONS ORDERED IN ED: Medications - No data to display   IMPRESSION / MDM / ASSESSMENT AND PLAN / ED COURSE  I reviewed the triage vital signs  and the nursing notes.   Differential diagnosis includes, but is not limited to, abscess to the scalp, cellulitis, open wound.  30 year old male presents to the ED with complaint of possible abscess to his scalp.  Patient has had abscess to his scalp in the past which cleared with antibiotics.  There are 2 areas specifically today that are tender without areas of drainage and no localized fluid for possible I&D.  Patient was started on clindamycin 3 times a day for the next 10 days.  Also prescription for oxycodone IR 5 mg every 6 hours as needed for severe pain especially with sleeping.  Patient is to follow-up with the dermatologist or his PCP if any continued problems or not improving.      Patient's presentation is most consistent with acute, uncomplicated illness.  FINAL CLINICAL IMPRESSION(S) / ED DIAGNOSES   Final diagnoses:  Abscess of scalp     Rx / DC Orders   ED Discharge Orders  Ordered    clindamycin (CLEOCIN) 300 MG capsule  3 times daily        04/08/22 0736    oxyCODONE (OXY IR/ROXICODONE) 5 MG immediate release tablet  Every 6 hours PRN        04/08/22 0736             Note:  This document was prepared using Dragon voice recognition software and may include unintentional dictation errors.   Tommi Rumps, PA-C 04/08/22 1009    Arnaldo Natal, MD 04/08/22 1304

## 2022-04-13 IMAGING — CT CT NECK W/ CM
4 of 5 series · 14 of 33 positions shown, 16 images · IV contrast (agent unspecified)
Comparison: None.

CLINICAL DATA: Left-sided neck mass posterior to the SCM, and lower
occipital mass, painful

EXAM:
CT NECK WITH CONTRAST
TECHNIQUE: Multidetector CT imaging of the neck was performed using the
standard protocol following the bolus administration of intravenous
contrast.

[Series 4: axial bone · axial · 0.71mm/px · z∈[-244,-124]mm · 3 of 121 slices shown]
[im 31/121  bone]
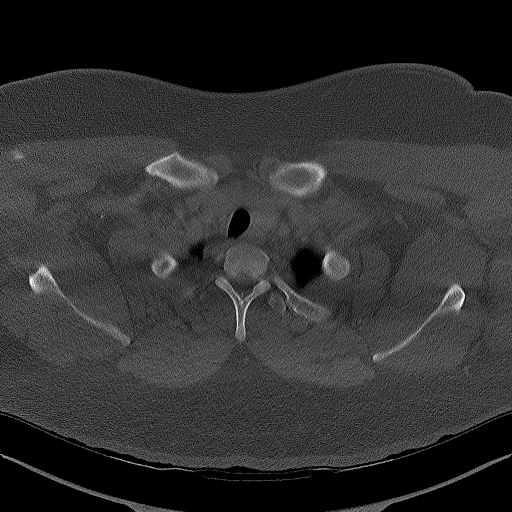
[im 61/121  bone]
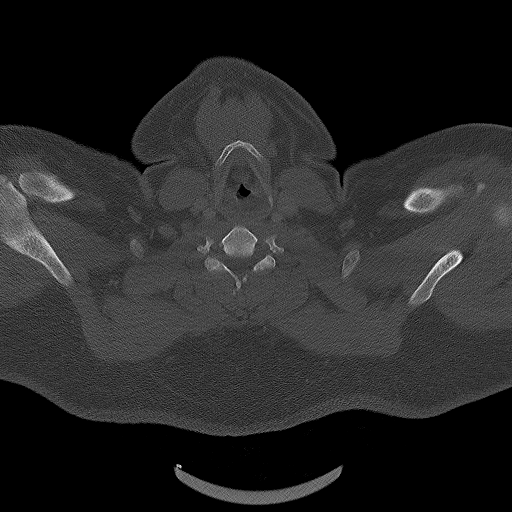
[im 91/121  bone]
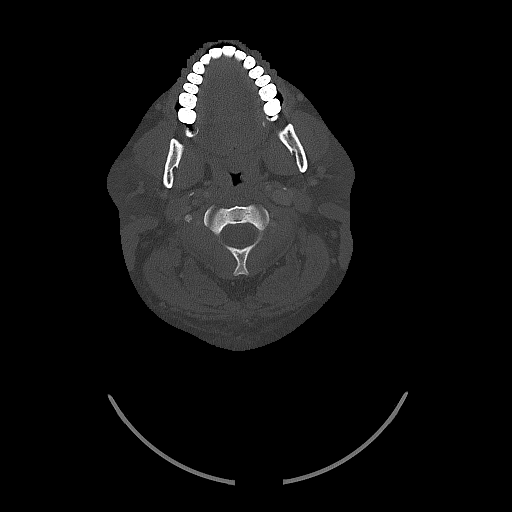

[Series 5: sag neck · sagittal · 0.50mm/px · 5 of 110 slices shown, 6 images]
[im 37/110  bone]
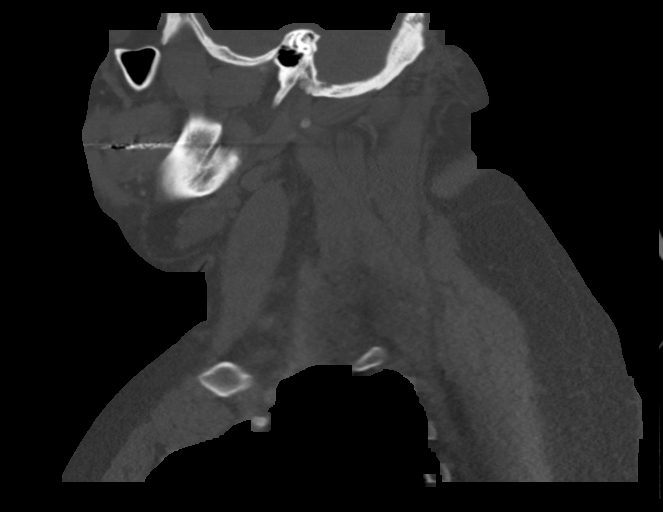
[im 46/110  bone]
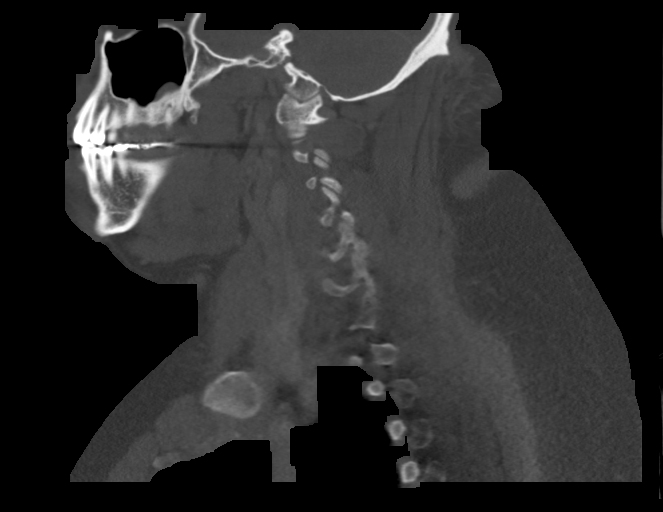
[im 55/110  soft-tissue]
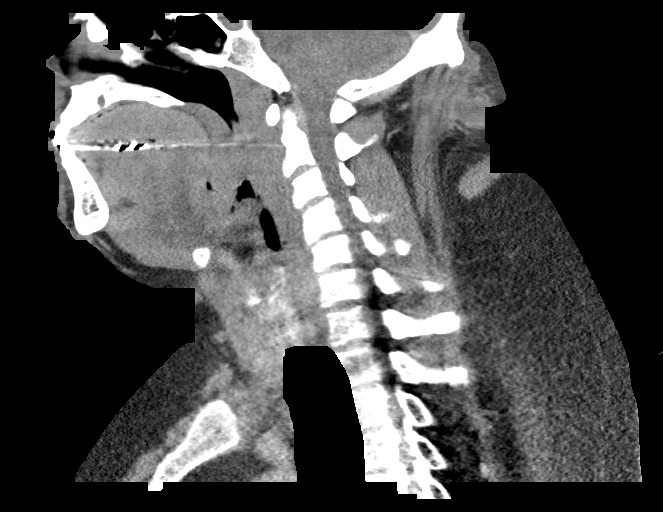
[im 55/110  bone]
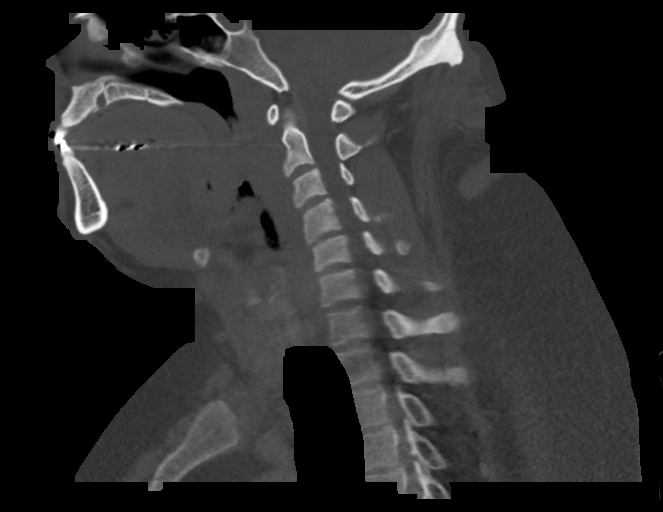
[im 64/110  bone]
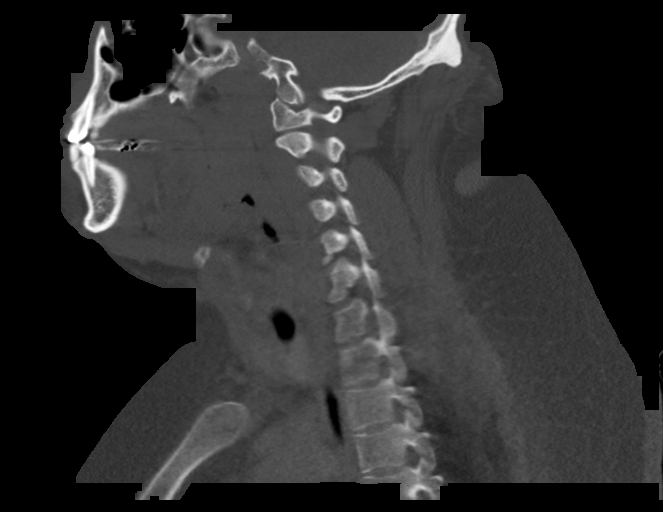
[im 73/110  bone]
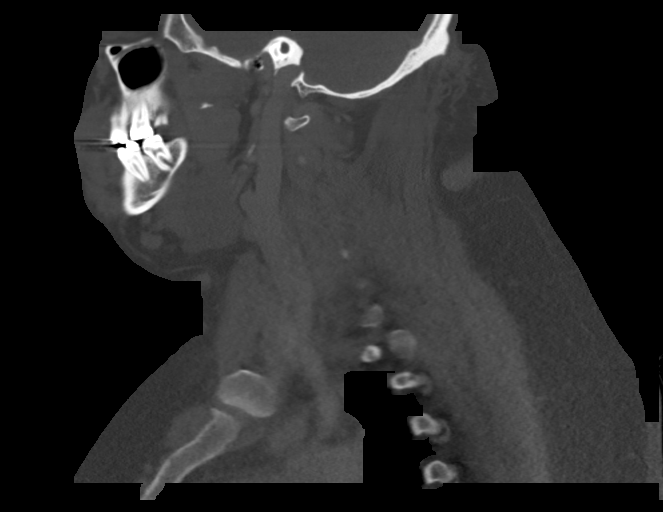

[Series 6: cor neck · coronal · 0.49mm/px · 3 of 155 slices shown]
[im 31/155  bone]
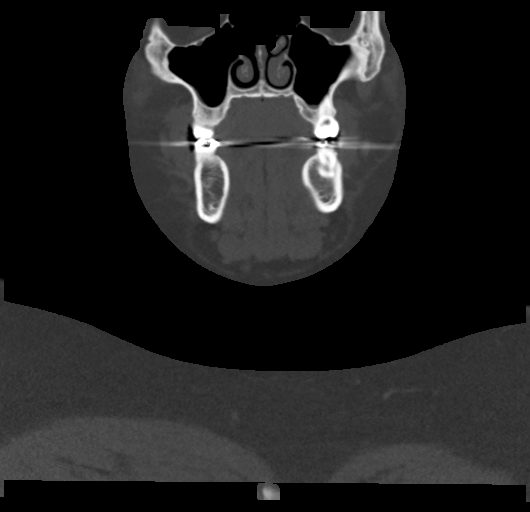
[im 62/155  bone]
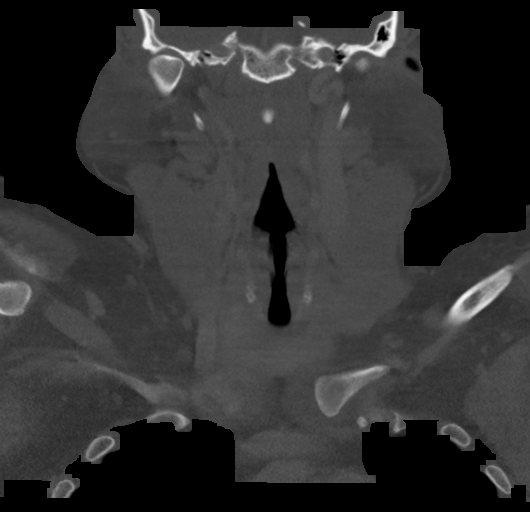
[im 93/155  bone]
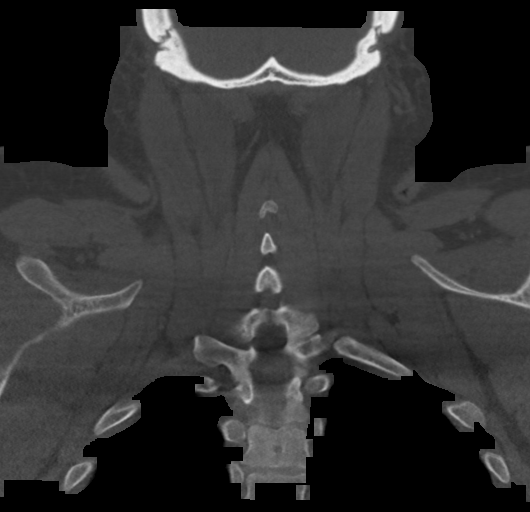

[Series 7: orthogonal (person_name) · axial · 0.55mm/px · z∈[-283,-155]mm · 3 of 134 slices shown, 4 images]
[im 34/134  soft-tissue]
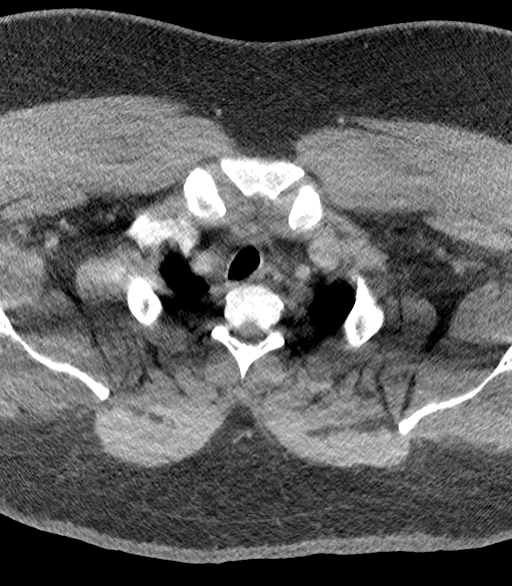
[im 34/134  bone]
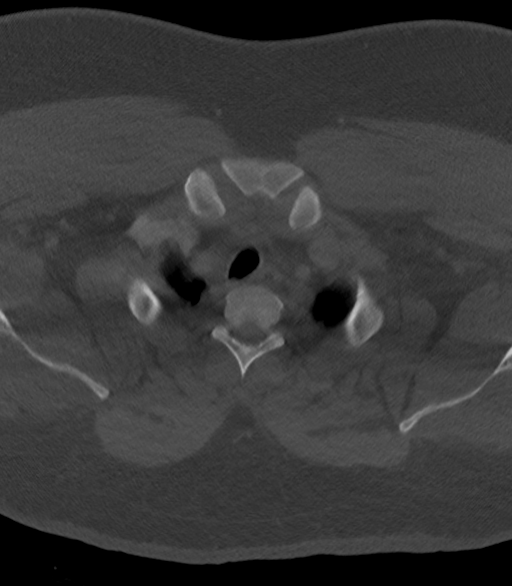
[im 67/134  bone]
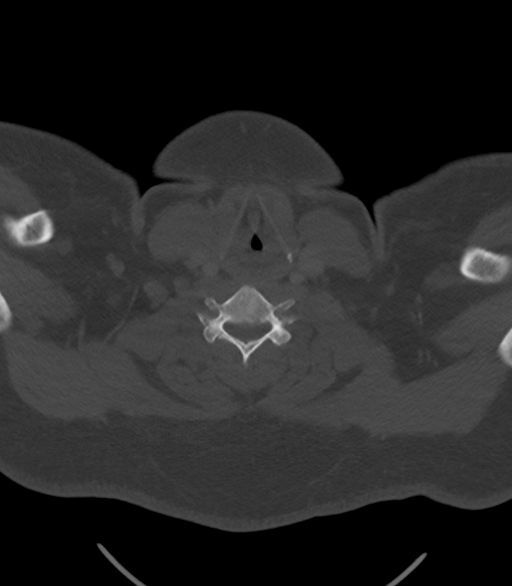
[im 100/134  bone]
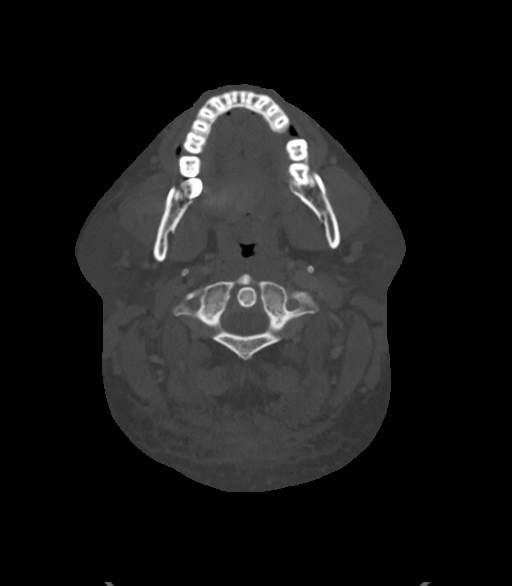

[14 of 33 positions shown; findings below may reference images not displayed]

RADIATION DOSE REDUCTION: This exam was performed according to the
departmental dose-optimization program which includes automated
exposure control, adjustment of the mA and/or kV according to
patient size and/or use of iterative reconstruction technique.

CONTRAST:  75mL OMNIPAQUE IOHEXOL 350 MG/ML SOLN
FINDINGS: Pharynx and larynx: The nasal cavity and nasopharynx are
unremarkable.

The oral cavity and oropharynx are unremarkable. The parapharyngeal
spaces are clear.

The hypopharynx and larynx are unremarkable. The vocal folds are
normal in appearance.

Salivary glands: The parotid and submandibular glands are
unremarkable.

Thyroid: Unremarkable.

Lymph nodes: There are scattered prominent cervical chain lymph
nodes bilaterally. The largest measures up to 1.1 cm on the left at
level IIB which may correspond to the clinically palpable
abnormality.

Vascular: Unremarkable.

Limited intracranial: The imaged portions of the intracranial
compartment are unremarkable.

Visualized orbits: The imaged globes and orbits are unremarkable.

Mastoids and visualized paranasal sinuses: There is mild mucosal
thickening along the floors of the maxillary sinuses. The mastoid
air cells are clear.

Skeleton: There is no acute osseous abnormality or aggressive
osseous lesion.

Upper chest: The imaged lung apices are clear.

Other: There is a 2.1 cm AP x 2.0 cm TV by 1.5 cm cc peripherally
enhancing lesion in the midline suboccipital soft tissues. There is
surrounding inflammatory fat stranding.
IMPRESSION: 1. 2.1 cm peripherally enhancing collection in the midline
suboccipital soft tissues with surrounding inflammatory change most
likely reflects an abscess.
2. Scattered prominent cervical chain lymph nodes measuring up to
1.1 cm on the left are likely reactive. This lymph node likely
corresponds to the clinically palpable abnormality as it underlies
the radiopaque marker. As this lesion is palpable, it may be
followed clinically.

## 2022-04-19 ENCOUNTER — Other Ambulatory Visit: Payer: Self-pay

## 2022-04-19 ENCOUNTER — Emergency Department
Admission: EM | Admit: 2022-04-19 | Discharge: 2022-04-19 | Disposition: A | Discharge status: Home / Self Care | Payer: Self-pay | Attending: Emergency Medicine | Admitting: Emergency Medicine

## 2022-04-19 ENCOUNTER — Encounter: Payer: Self-pay | Admitting: Emergency Medicine

## 2022-04-19 DIAGNOSIS — J45909 Unspecified asthma, uncomplicated: Secondary | ICD-10-CM | POA: Insufficient documentation

## 2022-04-19 DIAGNOSIS — L02811 Cutaneous abscess of head [any part, except face]: Secondary | ICD-10-CM | POA: Insufficient documentation

## 2022-04-19 DIAGNOSIS — H1032 Unspecified acute conjunctivitis, left eye: Secondary | ICD-10-CM | POA: Insufficient documentation

## 2022-04-19 MED ORDER — MELOXICAM 15 MG PO TABS: 15.0000 mg | ORAL_TABLET | Freq: Every day | ORAL | 0 refills | Status: DC

## 2022-04-19 MED ORDER — OFLOXACIN 0.3 % OP SOLN: 1.0000 [drp] | Freq: Four times a day (QID) | OPHTHALMIC | 0 refills | Status: DC

## 2022-04-19 MED ORDER — DOXYCYCLINE MONOHYDRATE 100 MG PO TABS: 100.0000 mg | ORAL_TABLET | Freq: Two times a day (BID) | ORAL | 0 refills | Status: DC

## 2022-04-19 MED ORDER — FLUORESCEIN SODIUM 1 MG OP STRP
1.0000 | ORAL_STRIP | Freq: Once | OPHTHALMIC | Status: AC
Start: 2022-04-19 — End: 2022-04-19
  Administered 2022-04-19: 1 via OPHTHALMIC
  Filled 2022-04-19: qty 1

## 2022-04-19 NOTE — Discharge Instructions (Addendum)
-  Take all of antibiotics as prescribed.  -You may take Tylenol and meloxicam as needed for pain.  -Follow-up with your dermatologist, as discussed.  -Return to the emergency department anytime if you begin to experience any new or worsening symptoms

## 2022-04-19 NOTE — ED Provider Notes (Signed)
[facility] Provider Note    Event Date/Time   First MD Initiated Contact with Patient 04/19/22 0720     (approximate)   History   Chief Complaint Abscess and Eye Problem   HPI Rodney Jones. is a 31 y.o. male, history of asthma, presents to the emergency department for evaluation of multiple small abscesses on the right side of his head.  Reports significant flaking and occasional discharge.  Patient states that he was seen here previously for abscess on the left side of his head on 04/08/2022 which resolved with antibiotics.  He states that he has an appointment scheduled with dermatology in a few weeks, however has to wait until his insurance kicks in.  Denies fever/chills, chest pain, shortness of breath, abdominal pain, nausea/vomiting, significant bleeding/discharge  Additionally, patient states that he woke up with eye redness and drainage this morning.  He states that he believes that he may have rubbed his eye against his pillow last night, causing injury.  Denies blurry vision, hearing changes, lightheadedness/dizziness, or fever/chills.  History Limitations: No limitations.        Physical Exam  Triage Vital Signs: ED Triage Vitals  Enc Vitals Group     BP 04/19/22 0713 (!) 171/119     Pulse Rate 04/19/22 0713 (!) 104     Resp 04/19/22 0713 20     Temp 04/19/22 0713 98.3 F (36.8 C)     Temp Source 04/19/22 0713 Oral     SpO2 04/19/22 0713 98 %     Weight 04/19/22 0712 (!) 315 lb 4.1 oz (143 kg)     Height 04/19/22 0712 5\' 8"  (1.727 m)     Head Circumference --      Peak Flow --      Pain Score 04/19/22 0712 9     Pain Loc --      Pain Edu? --      Excl. in GC? --     Most recent vital signs: Vitals:   04/19/22 0713  BP: (!) 171/119  Pulse: (!) 104  Resp: 20  Temp: 98.3 F (36.8 C)  SpO2: 98%    General: Awake, NAD.  Eyes: PERRL.  Left eye notably erythematous with green/yellow drainage.  Crusting noted on the eyelid.   No pain with EOMI. Gross visual acuity intact. CV: Good peripheral perfusion.  Resp: Normal effort.  Abd: Soft, non-tender. No distention.  Neuro: At baseline. No gross neurological deficits.   Focused Exam: Multiple 1-2 mm abscess/pustules noted along the right side of the patient's scalp with scab/flakes scattered diffusely in his hair.  No active bleeding or drainage at this time.  Tender with palpation.   No evidence of corneal abrasions or corneal ulcers on fluorescein examination of the left eye.    Physical Exam    ED Results / Procedures / Treatments  Labs (all labs ordered are listed, but only abnormal results are displayed) Labs Reviewed - No data to display   EKG N/A.   RADIOLOGY  ED Provider Interpretation: N/A  No results found.  PROCEDURES:  Critical Care performed: N/A.  Procedures    MEDICATIONS ORDERED IN ED: Medications  fluorescein ophthalmic strip 1 strip (1 strip Left Eye Given 04/19/22 0736)     IMPRESSION / MDM / ASSESSMENT AND PLAN / ED COURSE  I reviewed the triage vital signs and the nursing notes.  Differential diagnosis includes, but is not limited to, cellulitis, abscess, folliculitis, contact dermatitis, insect bites.   ED Course Patient appears well, NAD.  Assessment/Plan Presentation consistent with abscess versus scalp folliculitis.  We will treat with prescription for doxycycline x 5 days. Patient is also requesting medicine for pain, specifically oxycodone.  He states that Tylenol and ibuprofen does not work for him.  We will prescribe him meloxicam.  Recommend that he follow-up with his dermatologist for ongoing care.  In regards to his left eye, I suspect bacterial conjunctivitis.  No evidence of corneal abrasion/ulcers on fluorescein examination.  Will prescribe ofloxacin ophthalmic drops.  Patient's presentation is most consistent with acute, uncomplicated illness.   Provided the patient with  anticipatory guidance, return precautions, and educational material. Encouraged the patient to return to the emergency department at any time if they begin to experience any new or worsening symptoms. Patient expressed understanding and agreed with the plan.       FINAL CLINICAL IMPRESSION(S) / ED DIAGNOSES   Final diagnoses:  Acute bacterial conjunctivitis of left eye  Scalp abscess     Rx / DC Orders   ED Discharge Orders          Ordered    doxycycline (ADOXA) 100 MG tablet  2 times Jones        04/19/22 0813    ofloxacin (OCUFLOX) 0.3 % ophthalmic solution  4 times Jones        04/19/22 0813    meloxicam (MOBIC) 15 MG tablet  Jones        04/19/22 0813             Note:  This document was prepared using Dragon voice recognition software and may include unintentional dictation errors.   Rodney Jones, [place] 04/19/22 3295    Rodney Natal, MD 04/19/22 1438

## 2022-04-19 NOTE — ED Triage Notes (Signed)
Pt reports has multiple abscess to the right side of his head. Pt states that he was seen here for them on the left side of his head previously and was given abx that took them away. Pt also reports hit himself in the left eye with his pillow last night and now his left eye is red and draining.

## 2022-04-24 ENCOUNTER — Emergency Department
Admission: EM | Admit: 2022-04-24 | Discharge: 2022-04-24 | Disposition: A | Discharge status: Home / Self Care | Payer: Self-pay | Attending: Emergency Medicine | Admitting: Emergency Medicine

## 2022-04-24 ENCOUNTER — Other Ambulatory Visit: Payer: Self-pay

## 2022-04-24 ENCOUNTER — Emergency Department: Payer: Self-pay

## 2022-04-24 DIAGNOSIS — R030 Elevated blood-pressure reading, without diagnosis of hypertension: Secondary | ICD-10-CM | POA: Insufficient documentation

## 2022-04-24 DIAGNOSIS — W19XXXA Unspecified fall, initial encounter: Secondary | ICD-10-CM | POA: Insufficient documentation

## 2022-04-24 DIAGNOSIS — S60221A Contusion of right hand, initial encounter: Secondary | ICD-10-CM | POA: Insufficient documentation

## 2022-04-24 DIAGNOSIS — J45909 Unspecified asthma, uncomplicated: Secondary | ICD-10-CM | POA: Insufficient documentation

## 2022-04-24 MED ORDER — OXYCODONE HCL 5 MG PO TABS
5.0000 mg | ORAL_TABLET | Freq: Once | ORAL | Status: AC
  Administered 2022-04-24: 5 mg via ORAL
  Filled 2022-04-24: qty 1

## 2022-04-24 MED ORDER — IBUPROFEN 800 MG PO TABS: 800.0000 mg | ORAL_TABLET | Freq: Three times a day (TID) | ORAL | 0 refills | Status: DC | PRN

## 2022-04-24 MED ORDER — HYDROCODONE-ACETAMINOPHEN 5-325 MG PO TABS: 1.0000 | ORAL_TABLET | Freq: Four times a day (QID) | ORAL | 0 refills | Status: DC | PRN

## 2022-04-24 NOTE — ED Notes (Signed)
See triage note  Presents with injury to right hand  States was helping someone and had a cinder block fall on hand  Swelling noted   good pulses

## 2022-04-24 NOTE — Discharge Instructions (Addendum)
Follow-up your primary care provider if any continued problems or concerns.  Ice and elevation.  Take medication only as directed.  Your medication was sent to the [facility] pharmacy on [address].  Also have your primary care provider or urgent care recheck your blood pressure was elevated today at 170/104.  If your blood pressure continues to be elevated it may be necessary for you to take blood pressure medication.

## 2022-04-24 NOTE — ED Triage Notes (Signed)
Pt states a friend dropped 2 cinder blocks on his right hand this morning and has noted swelling .

## 2022-04-24 NOTE — ED Provider Notes (Signed)
[facility] Provider Note    Event Date/Time   First MD Initiated Contact with Patient 04/24/22 [phone]     (approximate)   History   Hand Injury   HPI  Rodney Jones. is a 31 y.o. male   presents to the ED with complaint of right hand pain.  Patient states that 2 cinderblocks fell onto his right hand approximately 3 to 4 hours ago and he has continued to have pain with some soft tissue swelling since that time.  Patient is right-hand dominant.  Patient has a history of asthma but denies other medical histories.      Physical Exam   Triage Vital Signs: ED Triage Vitals  Enc Vitals Group     BP 04/24/22 0744 (!) 170/104     Pulse Rate 04/24/22 0744 (!) 108     Resp 04/24/22 0744 20     Temp 04/24/22 0744 98.6 F (37 C)     Temp Source 04/24/22 0744 Oral     SpO2 04/24/22 0744 96 %     Weight 04/24/22 0728 (!) 316 lb (143.3 kg)     Height 04/24/22 0728 5\' 8"  (1.727 m)     Head Circumference --      Peak Flow --      Pain Score 04/24/22 0728 8     Pain Loc --      Pain Edu? --      Excl. in GC? --     Most recent vital signs: Vitals:   04/24/22 0744 04/24/22 0835  BP: (!) 170/104 (!) 168/94  Pulse: (!) 108 98  Resp: 20 20  Temp: 98.6 F (37 C)   SpO2: 96% 96%     General: Awake, no distress.  CV:  Good peripheral perfusion.  Resp:  Normal effort.  Abd:  No distention.  Other:  On examination of the right hand there is moderate soft tissue edema noted on the dorsal aspect over the fourth and fifth metacarpal.  Patient is able to flex and extend digits without difficulty.  Skin is intact.  Capillary refills less than 3 seconds.  Radial pulses present.   ED Results / Procedures / Treatments   Labs (all labs ordered are listed, but only abnormal results are displayed) Labs Reviewed - No data to display    RADIOLOGY Right hand x-ray images were reviewed by me independent of the radiologist and no fracture was seen.  Radiology  report is negative for fracture.    PROCEDURES:  Critical Care performed:   Procedures   MEDICATIONS ORDERED IN ED: Medications  oxyCODONE (Oxy IR/ROXICODONE) immediate release tablet 5 mg (5 mg Oral Given 04/24/22 06/25/22)     IMPRESSION / MDM / ASSESSMENT AND PLAN / ED COURSE  I reviewed the triage vital signs and the nursing notes.   Differential diagnosis includes, but is not limited to, contusion right hand, fracture right hand.  31 year old male presents to the ED with injury to his right hand that happened approximately 3 hours prior to arrival.  Patient states that 2 cinderblocks were dropped on his right hand.  There is moderate amount of soft tissue edema and tenderness on palpation.  X-rays were negative for acute fracture and patient was made aware.  He also was instructed to ice and elevate to reduce swelling an Ace wrap was applied for protection.  A prescription for hydrocodone and ibuprofen was sent to the pharmacy.  He is to follow-up with Dr.  Signa Kell who is on-call for orthopedics if any continued problems with his hand.      Patient's presentation is most consistent with acute complicated illness / injury requiring diagnostic workup.  FINAL CLINICAL IMPRESSION(S) / ED DIAGNOSES   Final diagnoses:  Contusion of right hand, initial encounter  Elevated blood pressure reading     Rx / DC Orders   ED Discharge Orders          Ordered    HYDROcodone-acetaminophen (NORCO/VICODIN) 5-325 MG tablet  Every 6 hours PRN        04/24/22 0813    ibuprofen (ADVIL) 800 MG tablet  Every 8 hours PRN        04/24/22 0813             Note:  This document was prepared using Dragon voice recognition software and may include unintentional dictation errors.   Tommi Rumps, PA-C 04/24/22 1355    Minna Antis, MD 04/24/22 [phone]

## 2022-05-12 ENCOUNTER — Encounter: Payer: Self-pay | Admitting: Emergency Medicine

## 2022-05-12 ENCOUNTER — Emergency Department: Payer: Self-pay

## 2022-05-12 ENCOUNTER — Other Ambulatory Visit: Payer: Self-pay

## 2022-05-12 ENCOUNTER — Emergency Department
Admission: EM | Admit: 2022-05-12 | Discharge: 2022-05-12 | Disposition: A | Discharge status: Home / Self Care | Payer: Self-pay | Attending: Student in an Organized Health Care Education/Training Program | Admitting: Student in an Organized Health Care Education/Training Program

## 2022-05-12 DIAGNOSIS — M25531 Pain in right wrist: Secondary | ICD-10-CM | POA: Insufficient documentation

## 2022-05-12 DIAGNOSIS — Y92007 Garden or yard of unspecified non-institutional (private) residence as the place of occurrence of the external cause: Secondary | ICD-10-CM | POA: Insufficient documentation

## 2022-05-12 DIAGNOSIS — W010XXA Fall on same level from slipping, tripping and stumbling without subsequent striking against object, initial encounter: Secondary | ICD-10-CM | POA: Insufficient documentation

## 2022-05-12 DIAGNOSIS — S93401A Sprain of unspecified ligament of right ankle, initial encounter: Secondary | ICD-10-CM | POA: Insufficient documentation

## 2022-05-12 MED ORDER — OXYCODONE-ACETAMINOPHEN 5-325 MG PO TABS
1.0000 | ORAL_TABLET | Freq: Once | ORAL | Status: AC
  Administered 2022-05-12: 1 via ORAL
  Filled 2022-05-12: qty 1

## 2022-05-12 MED ORDER — OXYCODONE-ACETAMINOPHEN 5-325 MG PO TABS
1.0000 | ORAL_TABLET | ORAL | 0 refills | Status: DC | PRN
Start: 2022-05-12 — End: 2022-07-06

## 2022-05-12 NOTE — ED Triage Notes (Signed)
Patient c/o right foot pain after chasing his child up a hill and foot got caught in a hole causing him to fall.

## 2022-05-12 NOTE — ED Provider Notes (Signed)
[facility] Provider Note    Event Date/Time   First MD Initiated Contact with Patient 05/12/22 [phone]     (approximate)   History   Foot Injury   HPI  Rodney Jones. is a 31 y.o. male presents to the ER for evaluation of right wrist and right ankle pain that occurred after patient was playing with his child yesterday and stepped in a hole in the yard and tripped fell felt a popping sensation in his ankle with sudden pain and pain with ambulation.  Did fall onto his right wrist.  Has injured the wrist in the past.  Denies any numbness or tingling did not hit his head.  No abdominal pain no pelvic pain no hip pain.     Physical Exam   Triage Vital Signs: ED Triage Vitals  Enc Vitals Group     BP 05/12/22 0917 (!) 154/113     Pulse Rate 05/12/22 0917 90     Resp 05/12/22 0917 18     Temp 05/12/22 0917 98.4 F (36.9 C)     Temp Source 05/12/22 0917 Oral     SpO2 05/12/22 0917 97 %     Weight 05/12/22 0916 (!) 316 lb (143.3 kg)     Height 05/12/22 0916 5\' 8"  (1.727 m)     Head Circumference --      Peak Flow --      Pain Score 05/12/22 0916 10     Pain Loc --      Pain Edu? --      Excl. in GC? --     Most recent vital signs: Vitals:   05/12/22 0917  BP: (!) 154/113  Pulse: 90  Resp: 18  Temp: 98.4 F (36.9 C)  SpO2: 97%     Constitutional: Alert  Eyes: Conjunctivae are normal.  Head: Atraumatic. Nose: No congestion/rhinnorhea. Mouth/Throat: Mucous membranes are moist.   Neck: Painless ROM.  Cardiovascular:   Good peripheral circulation. Respiratory: Normal respiratory effort.  No retractions.  Gastrointestinal: Soft and nontender.  Musculoskeletal: Swelling and tenderness palpation of the lateral right ankle.  No midfoot instability.  Neurovascular intact.  No proximal fibular pain or knee pain on exam.  Compartments are soft.  Right wrist does have contusion and swelling over the distal ulna.  Neurovascular intact compartments  soft. Neurologic:  MAE spontaneously. No gross focal neurologic deficits are appreciated.  Skin:  Skin is warm, dry and intact. No rash noted. Psychiatric: Mood and affect are normal. Speech and behavior are normal.    ED Results / Procedures / Treatments   Labs (all labs ordered are listed, but only abnormal results are displayed) Labs Reviewed - No data to display   EKG     RADIOLOGY Please see ED Course for my review and interpretation.  I personally reviewed all radiographic images ordered to evaluate for the above acute complaints and reviewed radiology reports and findings.  These findings were personally discussed with the patient.  Please see medical record for radiology report.    PROCEDURES:  Critical Care performed:   Procedures   MEDICATIONS ORDERED IN ED: Medications  oxyCODONE-acetaminophen (PERCOCET/ROXICET) 5-325 MG per tablet 1 tablet (1 tablet Oral Given 05/12/22 1011)     IMPRESSION / MDM / ASSESSMENT AND PLAN / ED COURSE  I reviewed the triage vital signs and the nursing notes.  Differential diagnosis includes, but is not limited to, sprain, fracture, dislocation  Patient presented to the ER for evaluation of symptoms as described above.  X-ray ordered for the but differential on my review and interpretation does not show any evidence of fracture or dislocation.  Consistent with sprain and contusion.  Patient placed on crutches as well as ankle stirrup given referral for outpatient follow-up.  Discussed conservative management.  Patient given prescription for short course of pain medication discussed rice therapy.      FINAL CLINICAL IMPRESSION(S) / ED DIAGNOSES   Final diagnoses:  Sprain of right ankle, unspecified ligament, initial encounter     Rx / DC Orders   ED Discharge Orders          Ordered    oxyCODONE-acetaminophen (PERCOCET) 5-325 MG tablet  Every 4 hours PRN        05/12/22 1009              Note:  This document was prepared using Dragon voice recognition software and may include unintentional dictation errors.    Willy Eddy, MD 05/12/22 1026

## 2022-05-12 NOTE — ED Notes (Signed)
See triage note  Presents with pain to right ankle  States he stepped in a hole yesterday. Having pain to posterior ankle area  Unable to bear full wt  Good pulses

## 2022-05-12 NOTE — ED Notes (Signed)
Air cast in place, crtuches provided with instructions given.

## 2022-05-12 NOTE — ED Notes (Signed)
Patient discharged to home per MD order. Patient in stable condition, and deemed medically cleared by ED provider for discharge. Discharge instructions reviewed with patient/family using "Teach Back"; verbalized understanding of medication education and administration, and information about follow-up care. Denies further concerns. ° °

## 2022-07-01 ENCOUNTER — Encounter: Payer: Self-pay | Admitting: Emergency Medicine

## 2022-07-01 ENCOUNTER — Emergency Department: Payer: Self-pay

## 2022-07-01 ENCOUNTER — Emergency Department
Admission: EM | Admit: 2022-07-01 | Discharge: 2022-07-01 | Disposition: A | Discharge status: Home / Self Care | Payer: Self-pay | Attending: Emergency Medicine | Admitting: Emergency Medicine

## 2022-07-01 ENCOUNTER — Other Ambulatory Visit: Payer: Self-pay

## 2022-07-01 DIAGNOSIS — W098XXA Fall on or from other playground equipment, initial encounter: Secondary | ICD-10-CM | POA: Insufficient documentation

## 2022-07-01 DIAGNOSIS — S63501A Unspecified sprain of right wrist, initial encounter: Secondary | ICD-10-CM | POA: Insufficient documentation

## 2022-07-01 DIAGNOSIS — Y9339 Activity, other involving climbing, rappelling and jumping off: Secondary | ICD-10-CM | POA: Insufficient documentation

## 2022-07-01 DIAGNOSIS — M7989 Other specified soft tissue disorders: Secondary | ICD-10-CM | POA: Insufficient documentation

## 2022-07-01 MED ORDER — HYDROCODONE-ACETAMINOPHEN 5-325 MG PO TABS
1.0000 | ORAL_TABLET | Freq: Four times a day (QID) | ORAL | 0 refills | Status: DC | PRN
Start: 2022-07-01 — End: 2022-07-06

## 2022-07-01 MED ORDER — OXYCODONE-ACETAMINOPHEN 5-325 MG PO TABS
2.0000 | ORAL_TABLET | Freq: Once | ORAL | Status: AC
  Administered 2022-07-01: 2 via ORAL
  Filled 2022-07-01: qty 2

## 2022-07-01 MED ORDER — ONDANSETRON 4 MG PO TBDP
4.0000 mg | ORAL_TABLET | Freq: Once | ORAL | Status: AC
  Administered 2022-07-01: 4 mg via ORAL
  Filled 2022-07-01: qty 1

## 2022-07-01 MED ORDER — IBUPROFEN 600 MG PO TABS: 600.0000 mg | ORAL_TABLET | Freq: Three times a day (TID) | ORAL | 0 refills | Status: DC | PRN

## 2022-07-01 NOTE — Discharge Instructions (Signed)
Please call [facility] at the number above to set up a follow-up appointment with x-ray in the next 5 to 7 days.  Wear the thumb splint essentially at all times until then.  It is okay to take this off to shower.  No heavy lifting or use of the hand until cleared by orthopedics.

## 2022-07-01 NOTE — ED Triage Notes (Signed)
Patient ambulatory to triage with steady gait, without difficulty or distress noted; pt reports last night he fell on trampoline landing on rt hand; c/o pain to wrist since

## 2022-07-01 NOTE — ED Provider Notes (Addendum)
[facility] Provider Note    Event Date/Time   First MD Initiated Contact with Patient 07/01/22 [phone]     (approximate)   History   Wrist Pain   HPI  Rodney Jones. is a 31 y.o. male  here with R wrist pain. Pt was jumping on his trampoline yesterday.  He fell, onto his right outstretched hand, then came down onto his abdomen.  Reports immediate onset of aching, throbbing, right wrist pain.  Since then, the pain has initially improved but then worsened throughout the night and is now severe.  Said associated hand swelling.  Try to go to work today at [employer], and was unable to work due to the pain.  He has had associated swelling.  No numbness or weakness.  No paresthesias.  Of note, he did have a cinderblock landed on this wrist about a month ago.  He is right-hand dominant.     Physical Exam   Triage Vital Signs: ED Triage Vitals  Enc Vitals Group     BP 07/01/22 0640 (!) 150/99     Pulse Rate 07/01/22 0640 99     Resp 07/01/22 0640 17     Temp 07/01/22 0640 98.5 F (36.9 C)     Temp Source 07/01/22 0640 Oral     SpO2 07/01/22 0640 96 %     Weight 07/01/22 0633 (!) 312 lb (141.5 kg)     Height 07/01/22 0633 5\' 8"  (1.727 m)     Head Circumference --      Peak Flow --      Pain Score 07/01/22 0643 10     Pain Loc --      Pain Edu? --      Excl. in [place]? --     Most recent vital signs: Vitals:   07/01/22 0640 07/01/22 0800  BP: (!) 150/99   Pulse: 99 98  Resp: 17   Temp: 98.5 F (36.9 C)   SpO2: 96% 97%     General: Awake, no distress.  CV:  Good peripheral perfusion.  Resp:  Normal effort.  Abd:  No distention.  Other:  Right hand with mild swelling about the dorsal aspect of the hand and distal wrist, with pinpoint tenderness over the left distal radius and anatomic snuffbox.  Distal strength and sensation fully intact.  Cap refill normal.  Fingers warm and well-perfused.   ED Results / Procedures / Treatments   Labs (all  labs ordered are listed, but only abnormal results are displayed) Labs Reviewed - No data to display   EKG    RADIOLOGY CT wrist right: No acute fracture   I also independently reviewed and agree with radiologist interpretations.   PROCEDURES:  Critical Care performed: No   MEDICATIONS ORDERED IN ED: Medications  oxyCODONE-acetaminophen (PERCOCET/ROXICET) 5-325 MG per tablet 2 tablet (2 tablets Oral Given 07/01/22 0807)  ondansetron (ZOFRAN-ODT) disintegrating tablet 4 mg (4 mg Oral Given 07/01/22 0807)     IMPRESSION / MDM / ASSESSMENT AND PLAN / ED COURSE  I reviewed the triage vital signs and the nursing notes.                              Ddx:  Differential includes the following, with pertinent life- or limb-threatening emergencies considered:  Wrist sprain, scaphoid fracture, distal wrist fracture, contusion, hematoma, neuropraxia  Patient's presentation is most consistent with acute presentation with potential  threat to life or bodily function.  MDM:  31 year old right-hand-dominant male here with right wrist pain after fall.  Patient has exquisite tenderness in the anatomic snuffbox with some mild swelling.  Plain films negative including view of the scaphoid.  Distal neurovasculature is intact.  Given his tenderness and swelling, will place in a thumb spica splint, have him follow-up with orthopedics for repeat x-ray.  Advised him to not use the hand until cleared by orthopedics.  Return precautions given.  Will give brief course of analgesia.   MEDICATIONS GIVEN IN ED: Medications  oxyCODONE-acetaminophen (PERCOCET/ROXICET) 5-325 MG per tablet 2 tablet (2 tablets Oral Given 07/01/22 0807)  ondansetron (ZOFRAN-ODT) disintegrating tablet 4 mg (4 mg Oral Given 07/01/22 0807)     Consults:     EMR reviewed       FINAL CLINICAL IMPRESSION(S) / ED DIAGNOSES   Final diagnoses:  Sprain of right wrist, initial encounter     Rx / DC Orders   ED  Discharge Orders     None        Note:  This document was prepared using Dragon voice recognition software and may include unintentional dictation errors.   Shaune Pollack, MD 07/01/22 2924    Shaune Pollack, MD 07/01/22 [phone]

## 2022-07-01 NOTE — ED Notes (Addendum)
Thumb spica placed by this RN. CMS intact distally, post application. Pt endorsing splint is comfortable and fits well.   Pt medicated per MAR. Pt endorsing he is not driving home, will wait in the lobby for his ride.

## 2022-07-01 NOTE — ED Notes (Signed)
Patient reports left side chest pain and lower back pain.

## 2022-07-06 ENCOUNTER — Emergency Department: Payer: Self-pay

## 2022-07-06 ENCOUNTER — Emergency Department
Admission: EM | Admit: 2022-07-06 | Discharge: 2022-07-06 | Disposition: A | Discharge status: Home / Self Care | Payer: Self-pay | Attending: Emergency Medicine | Admitting: Emergency Medicine

## 2022-07-06 ENCOUNTER — Other Ambulatory Visit: Payer: Self-pay

## 2022-07-06 DIAGNOSIS — W109XXA Fall (on) (from) unspecified stairs and steps, initial encounter: Secondary | ICD-10-CM | POA: Insufficient documentation

## 2022-07-06 DIAGNOSIS — R030 Elevated blood-pressure reading, without diagnosis of hypertension: Secondary | ICD-10-CM | POA: Insufficient documentation

## 2022-07-06 DIAGNOSIS — S300XXA Contusion of lower back and pelvis, initial encounter: Secondary | ICD-10-CM | POA: Insufficient documentation

## 2022-07-06 DIAGNOSIS — J45909 Unspecified asthma, uncomplicated: Secondary | ICD-10-CM | POA: Insufficient documentation

## 2022-07-06 DIAGNOSIS — S60221A Contusion of right hand, initial encounter: Secondary | ICD-10-CM | POA: Insufficient documentation

## 2022-07-06 MED ORDER — KETOROLAC TROMETHAMINE 10 MG PO TABS: 10.0000 mg | ORAL_TABLET | Freq: Four times a day (QID) | ORAL | 0 refills | Status: DC | PRN

## 2022-07-06 MED ORDER — KETOROLAC TROMETHAMINE 30 MG/ML IJ SOLN
30.0000 mg | Freq: Once | INTRAMUSCULAR | Status: AC
  Administered 2022-07-06: 30 mg via INTRAMUSCULAR
  Filled 2022-07-06: qty 1

## 2022-07-06 NOTE — Discharge Instructions (Addendum)
Call the clinics listed on your discharge papers.  You are also eligible to be seen at [facility] which is free and since you do not have any insurance to qualify for it right now.  You will need to call the clinic to see when they have their next appointment.  Your blood pressure was elevated in the emergency department both times that it was taken.  First time was 162/104 and the second time was 152/106.  A prescription for Toradol was sent to the pharmacy to take every 6 hours as needed.  Discontinue taking ibuprofen.  You may take Tylenol sparingly if additional pain medication is needed.  Use ice to your hand as needed for discomfort and ice or heat to your back as needed.  Call today to schedule an appointment for recheck of your blood pressure

## 2022-07-06 NOTE — ED Notes (Signed)
See triage note  States he tripped and fell  Having pain to back and right shoulder area  Ambulates well

## 2022-07-06 NOTE — ED Triage Notes (Signed)
Pt comes with c/o lower back, right shoulder and hand pain. Pt states he tripped and fell yesterday. Pt states his hand is swollen.

## 2022-07-06 NOTE — ED Provider Notes (Signed)
[facility] Provider Note    Event Date/Time   First MD Initiated Contact with Patient 07/06/22 [phone]     (approximate)   History   Fall   HPI  Rodney Albor. is a 31 y.o. male presents to the ED with complaint of low back pain and right hand pain after he fell last evening.  Patient states that he missed a step causing him to fall.  He denies any head injury or loss of consciousness.  He has continued to have pain since that time and has taken Tylenol 3 tablets and ibuprofen 6 tablets without any relief.  He states that this morning he is only taken Tylenol.  Patient has history of asthma.     Physical Exam   Triage Vital Signs: ED Triage Vitals  Enc Vitals Group     BP 07/06/22 0706 (!) 162/104     Pulse Rate 07/06/22 0706 (!) 112     Resp 07/06/22 0706 20     Temp 07/06/22 0706 98 F (36.7 C)     Temp src --      SpO2 07/06/22 0706 96 %     Weight 07/06/22 0722 (!) 311 lb 15.2 oz (141.5 kg)     Height 07/06/22 0722 5\' 8"  (1.727 m)     Head Circumference --      Peak Flow --      Pain Score 07/06/22 0706 10     Pain Loc --      Pain Edu? --      Excl. in [place]? --     Most recent vital signs: Vitals:   07/06/22 0712 07/06/22 0842  BP: (!) 152/106 (!) 148/98  Pulse:  99  Resp:  20  Temp:    SpO2:  98%     General: Awake, no distress.  Alert, talkative. CV:  Good peripheral perfusion.  Resp:  Normal effort.  Abd:  No distention.  Other:  Right hand diffusely tender but no soft tissue edema or deformity noted.  No ecchymosis.  Patient is able to move digits distally.  No cervical or thoracic tenderness.  Patient has tenderness to the lower lumbar area on palpation and paravertebral muscles bilaterally.  No ecchymosis or abrasions are noted.  Nontender lower extremities.  Patient is able to stand and ambulate without any assistance.   ED Results / Procedures / Treatments   Labs (all labs ordered are listed, but only abnormal results  are displayed) Labs Reviewed - No data to display    RADIOLOGY Right hand x-ray images were reviewed by myself and interpreted as negative.  Radiology report confirms. Lumbar spine x-ray images were reviewed with no acute bony abnormality noted.  Radiology report confirms.    PROCEDURES:  Critical Care performed:   Procedures   MEDICATIONS ORDERED IN ED: Medications  ketorolac (TORADOL) 30 MG/ML injection 30 mg (30 mg Intramuscular Given 07/06/22 0731)     IMPRESSION / MDM / ASSESSMENT AND PLAN / ED COURSE  I reviewed the triage vital signs and the nursing notes.   Differential diagnosis includes, but is not limited to, contusion right hand, fracture, sprain, lumbar pain, strain, fracture secondary to fall.  31 year old male presents to the ED with complaint of right hand pain and lower lumbar pain after a fall that occurred yesterday.  Patient denies any head injury or loss of consciousness.  X-rays were reassuring and patient was made aware that there was no fracture noted on  his right hand or lower back.  He is encouraged to use ice and elevation to his right hand as this is the most tender area of his injury.  We also discussed elevation of his blood pressure and that he should follow-up with a PCP to have his blood pressure rechecked.  He states that currently he does not have a PCP and a list of clinics was on his discharge papers for him to follow-up with.  He is encouraged to call today make an appointment as it may be a month or more before he can be seen as a new patient.      Patient's presentation is most consistent with acute complicated illness / injury requiring diagnostic workup.  FINAL CLINICAL IMPRESSION(S) / ED DIAGNOSES   Final diagnoses:  Contusion of lower back, initial encounter  Contusion of right hand, initial encounter  Elevated blood pressure reading     Rx / DC Orders   ED Discharge Orders          Ordered    ketorolac (TORADOL) 10 MG  tablet  Every 6 hours PRN        07/06/22 4536             Note:  This document was prepared using Dragon voice recognition software and may include unintentional dictation errors.   Rodney Rumps, PA-C 07/06/22 1355    Rodney Antis, MD 07/06/22 [phone]

## 2022-08-09 ENCOUNTER — Emergency Department
Admission: EM | Admit: 2022-08-09 | Discharge: 2022-08-09 | Disposition: A | Discharge status: Home / Self Care | Payer: Self-pay | Attending: Emergency Medicine | Admitting: Emergency Medicine

## 2022-08-09 ENCOUNTER — Encounter: Payer: Self-pay | Admitting: Emergency Medicine

## 2022-08-09 ENCOUNTER — Other Ambulatory Visit: Payer: Self-pay

## 2022-08-09 ENCOUNTER — Emergency Department: Payer: Self-pay

## 2022-08-09 DIAGNOSIS — W108XXA Fall (on) (from) other stairs and steps, initial encounter: Secondary | ICD-10-CM | POA: Insufficient documentation

## 2022-08-09 DIAGNOSIS — M545 Low back pain, unspecified: Secondary | ICD-10-CM | POA: Insufficient documentation

## 2022-08-09 DIAGNOSIS — J45909 Unspecified asthma, uncomplicated: Secondary | ICD-10-CM | POA: Insufficient documentation

## 2022-08-09 DIAGNOSIS — S66911A Strain of unspecified muscle, fascia and tendon at wrist and hand level, right hand, initial encounter: Secondary | ICD-10-CM | POA: Insufficient documentation

## 2022-08-09 MED ORDER — HYDROCODONE-ACETAMINOPHEN 5-325 MG PO TABS: 1.0000 | ORAL_TABLET | Freq: Four times a day (QID) | ORAL | 0 refills | Status: AC | PRN

## 2022-08-09 MED ORDER — HYDROCODONE-ACETAMINOPHEN 5-325 MG PO TABS
1.0000 | ORAL_TABLET | Freq: Once | ORAL | Status: AC
  Administered 2022-08-09: 1 via ORAL
  Filled 2022-08-09: qty 1

## 2022-08-09 MED ORDER — METHOCARBAMOL 500 MG PO TABS: 500.0000 mg | ORAL_TABLET | Freq: Four times a day (QID) | ORAL | 0 refills | Status: DC

## 2022-08-09 MED ORDER — NAPROXEN 500 MG PO TABS: 500.0000 mg | ORAL_TABLET | Freq: Two times a day (BID) | ORAL | 0 refills | Status: DC

## 2022-08-09 NOTE — ED Provider Notes (Signed)
[facility] Provider Note    Event Date/Time   First MD Initiated Contact with Patient 08/09/22 [phone]     (approximate)   History   Fall, Back Pain, and Hand Pain   HPI  Rodney Jones. is a 31 y.o. male with history of asthma presents to the emergency department for evaluation after he slipped on some stairs and fell. He landed on his lower back, but tried to catch himself with his right hand. He now has right hand and low back pain. Injury occurred last  night. No relief with tylenol or ibuprofen.    Physical Exam   Triage Vital Signs: ED Triage Vitals  Enc Vitals Group     BP 08/09/22 0713 (!) 167/95     Pulse Rate 08/09/22 0713 82     Resp 08/09/22 0713 20     Temp 08/09/22 0713 98 F (36.7 C)     Temp Source 08/09/22 0713 Oral     SpO2 08/09/22 0713 99 %     Weight 08/09/22 0712 (!) 320 lb (145.2 kg)     Height 08/09/22 0712 5\' 8"  (1.727 m)     Head Circumference --      Peak Flow --      Pain Score 08/09/22 0712 10     Pain Loc --      Pain Edu? --      Excl. in [place]? --     Most recent vital signs: Vitals:   08/09/22 0713  BP: (!) 167/95  Pulse: 82  Resp: 20  Temp: 98 F (36.7 C)  SpO2: 99%     General: Awake, no distress.  CV:  Good peripheral perfusion.  Resp:  Normal effort.  Abd:  No distention.  Other:  Transverse lower back pain without focal area over vertebrae.    Focal tenderness in area of scaphoid on right hand.   ED Results / Procedures / Treatments   Labs (all labs ordered are listed, but only abnormal results are displayed) Labs Reviewed - No data to display   EKG  Not indicated.   RADIOLOGY     PROCEDURES:  Critical Care performed: No  Procedures   MEDICATIONS ORDERED IN ED: Medications  HYDROcodone-acetaminophen (NORCO/VICODIN) 5-325 MG per tablet 1 tablet (1 tablet Oral Given 08/09/22 0849)     IMPRESSION / MDM / ASSESSMENT AND PLAN / ED COURSE  I reviewed the triage vital signs  and the nursing notes.                              Differential diagnosis includes, but is not limited to, scaphoid fracture; hand sprain; vertebral fracture/compression fracture; musculoskeletal pain.  Patient's presentation is most consistent with acute illness / injury with system symptoms.  31 year old male presenting to the emergency department for treatment and evaluation after sustaining a mechanical, nonsyncopal fall last night.  See HPI for further details.  On exam, he does have some focal tenderness in the area of the scaphoid on the right hand.  X-ray ordered.  He also has some diffuse, transverse low back pain and x-ray ordered for that as well.  Norco ordered for pain.  Image of the right hand and low back are overall reassuring.  Because he does have pain in the area of scaphoid he will be placed in a wrist splint with thumb extension.  It was explained to the patient the importance  of wearing this and following up with orthopedics.  He will be discharged home with prescriptions as listed below.  He is to see primary care, orthopedics, or return to the emergency department for symptoms of change or worsen or for new concerns.     FINAL CLINICAL IMPRESSION(S) / ED DIAGNOSES   Final diagnoses:  Hand strain, right, initial encounter  Acute bilateral low back pain without sciatica     Rx / DC Orders   ED Discharge Orders          Ordered    HYDROcodone-acetaminophen (NORCO/VICODIN) 5-325 MG tablet  Every 6 hours PRN        08/09/22 0929    methocarbamol (ROBAXIN) 500 MG tablet  4 times daily        08/09/22 0929    naproxen (NAPROSYN) 500 MG tablet  2 times daily with meals        08/09/22 6203             Note:  This document was prepared using Dragon voice recognition software and may include unintentional dictation errors.   Chinita Pester, FNP 08/09/22 5597    Shaune Pollack, MD 08/09/22 2008

## 2022-08-09 NOTE — ED Triage Notes (Signed)
Pt reports last pm slipped and fell hurting his right hand and lower back. Pt reports already had an injury to his right hand and now it hurts worse. Pt reports pain in hand is worse than back. Denies head injuries or LOC.

## 2022-08-09 NOTE — ED Notes (Signed)
Fell last night on stairs. C/o back pain, lower back. No obvious injuries noted. Pt also c/o r hand pain.

## 2022-08-23 ENCOUNTER — Other Ambulatory Visit: Payer: Self-pay

## 2022-08-23 ENCOUNTER — Emergency Department
Admission: EM | Admit: 2022-08-23 | Discharge: 2022-08-23 | Disposition: A | Discharge status: Home / Self Care | Payer: Self-pay | Attending: Emergency Medicine | Admitting: Emergency Medicine

## 2022-08-23 ENCOUNTER — Emergency Department: Payer: Self-pay

## 2022-08-23 DIAGNOSIS — M791 Myalgia, unspecified site: Secondary | ICD-10-CM | POA: Insufficient documentation

## 2022-08-23 DIAGNOSIS — M25511 Pain in right shoulder: Secondary | ICD-10-CM | POA: Insufficient documentation

## 2022-08-23 DIAGNOSIS — S93402A Sprain of unspecified ligament of left ankle, initial encounter: Secondary | ICD-10-CM | POA: Insufficient documentation

## 2022-08-23 DIAGNOSIS — W230XXA Caught, crushed, jammed, or pinched between moving objects, initial encounter: Secondary | ICD-10-CM | POA: Insufficient documentation

## 2022-08-23 DIAGNOSIS — M79609 Pain in unspecified limb: Secondary | ICD-10-CM

## 2022-08-23 MED ORDER — ONDANSETRON 4 MG PO TBDP: 4.0000 mg | ORAL_TABLET | Freq: Three times a day (TID) | ORAL | 0 refills | Status: DC | PRN

## 2022-08-23 MED ORDER — TRAMADOL HCL 50 MG PO TABS: 50.0000 mg | ORAL_TABLET | Freq: Four times a day (QID) | ORAL | 0 refills | Status: DC | PRN

## 2022-08-23 MED ORDER — HYDROCODONE-ACETAMINOPHEN 5-325 MG PO TABS
1.0000 | ORAL_TABLET | Freq: Once | ORAL | Status: AC
  Administered 2022-08-23: 1 via ORAL
  Filled 2022-08-23: qty 1

## 2022-08-23 MED ORDER — NAPROXEN 500 MG PO TABS: 500.0000 mg | ORAL_TABLET | Freq: Two times a day (BID) | ORAL | 0 refills | Status: DC
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
## 2022-09-14 ENCOUNTER — Emergency Department
Admission: EM | Admit: 2022-09-14 | Discharge: 2022-09-14 | Disposition: A | Discharge status: Home / Self Care | Payer: Self-pay | Attending: Emergency Medicine | Admitting: Emergency Medicine

## 2022-09-14 ENCOUNTER — Encounter: Payer: Self-pay | Admitting: Emergency Medicine

## 2022-09-14 ENCOUNTER — Other Ambulatory Visit: Payer: Self-pay

## 2022-09-14 ENCOUNTER — Emergency Department: Payer: Self-pay

## 2022-09-14 DIAGNOSIS — M79672 Pain in left foot: Secondary | ICD-10-CM | POA: Insufficient documentation

## 2022-09-14 DIAGNOSIS — W108XXA Fall (on) (from) other stairs and steps, initial encounter: Secondary | ICD-10-CM | POA: Insufficient documentation

## 2022-09-14 DIAGNOSIS — M25572 Pain in left ankle and joints of left foot: Secondary | ICD-10-CM | POA: Insufficient documentation

## 2022-09-14 MED ORDER — ACETAMINOPHEN 325 MG PO TABS
650.0000 mg | ORAL_TABLET | Freq: Once | ORAL | Status: AC
  Administered 2022-09-14: 650 mg via ORAL
  Filled 2022-09-14: qty 2

## 2022-09-14 MED ORDER — NAPROXEN 500 MG PO TABS: 500.0000 mg | ORAL_TABLET | Freq: Two times a day (BID) | ORAL | 0 refills | Status: AC

## 2022-09-14 NOTE — ED Notes (Signed)
See triage note  Presents with pain to left ankle/foot  States he had injury about 1 week ago   Thinks he may have re injured it again  Ambulates with slight limp d/t pain

## 2022-09-14 NOTE — ED Triage Notes (Signed)
Pt presents via POV with complaints of left ankle pain. He notes falling a week ago injuring his ankle and believes he may have "re-sprained it" tonight causing pain. Pt ambulatory with visible discomfort.

## 2022-09-14 NOTE — Discharge Instructions (Addendum)
Your xrays are normal. Return for new, worsening or change in symptoms or other concerns.
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
## 2022-10-10 ENCOUNTER — Encounter: Payer: Self-pay | Admitting: Emergency Medicine

## 2022-10-10 ENCOUNTER — Emergency Department: Payer: Self-pay

## 2022-10-10 ENCOUNTER — Emergency Department
Admission: EM | Admit: 2022-10-10 | Discharge: 2022-10-10 | Discharge status: Against Medical Advice | Payer: Self-pay | Attending: Emergency Medicine | Admitting: Emergency Medicine

## 2022-10-10 DIAGNOSIS — M546 Pain in thoracic spine: Secondary | ICD-10-CM | POA: Insufficient documentation

## 2022-10-10 DIAGNOSIS — M79672 Pain in left foot: Secondary | ICD-10-CM | POA: Insufficient documentation

## 2022-10-10 DIAGNOSIS — Z5321 Procedure and treatment not carried out due to patient leaving prior to being seen by health care provider: Secondary | ICD-10-CM | POA: Insufficient documentation

## 2022-10-10 DIAGNOSIS — M545 Low back pain, unspecified: Secondary | ICD-10-CM | POA: Insufficient documentation

## 2022-10-10 DIAGNOSIS — W11XXXA Fall on and from ladder, initial encounter: Secondary | ICD-10-CM | POA: Insufficient documentation

## 2022-10-10 NOTE — ED Notes (Signed)
Pt called on the phone number listed - no answer.
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
## 2022-10-10 NOTE — ED Triage Notes (Addendum)
Pt presents via POV with complaints of fall yesterday off 3-4 steps on a ladder. He notes landing on his back and endorses upper and lower back pain with associated left foot pain. He states hitting his head but denies LOC - not on thinners. No neck tenderness, CP or SOB. Pt ambulatory without difficulty.

## 2022-10-14 ENCOUNTER — Other Ambulatory Visit: Payer: Self-pay

## 2022-10-14 ENCOUNTER — Emergency Department
Admission: EM | Admit: 2022-10-14 | Discharge: 2022-10-14 | Disposition: A | Discharge status: Home / Self Care | Payer: Self-pay | Attending: Emergency Medicine | Admitting: Emergency Medicine

## 2022-10-14 DIAGNOSIS — M545 Low back pain, unspecified: Secondary | ICD-10-CM | POA: Insufficient documentation

## 2022-10-14 DIAGNOSIS — M79621 Pain in right upper arm: Secondary | ICD-10-CM | POA: Insufficient documentation

## 2022-10-14 DIAGNOSIS — W11XXXA Fall on and from ladder, initial encounter: Secondary | ICD-10-CM | POA: Insufficient documentation

## 2022-10-14 DIAGNOSIS — W19XXXA Unspecified fall, initial encounter: Secondary | ICD-10-CM

## 2022-10-14 DIAGNOSIS — M25572 Pain in left ankle and joints of left foot: Secondary | ICD-10-CM | POA: Insufficient documentation

## 2022-10-14 DIAGNOSIS — J45909 Unspecified asthma, uncomplicated: Secondary | ICD-10-CM | POA: Insufficient documentation

## 2022-10-14 MED ORDER — CYCLOBENZAPRINE HCL 10 MG PO TABS: 10.0000 mg | ORAL_TABLET | Freq: Three times a day (TID) | ORAL | 0 refills | Status: AC | PRN

## 2022-10-14 MED ORDER — DICLOFENAC SODIUM 1 % EX GEL: 2.0000 g | Freq: Four times a day (QID) | CUTANEOUS | 0 refills | Status: AC

## 2022-10-14 NOTE — ED Provider Notes (Signed)
[facility] Provider Note    Event Date/Time   First MD Initiated Contact with Patient 10/14/22 1303     (approximate)   History   Chief Complaint Fall   HPI Rodney Jones. is a 31 y.o. male, history of asthma, presents to the emergency department for evaluation of fall.  Patient states that he fell down 3-4 steps on a ladder and landed on his back.  He believes he sprained his left foot as well.  He was seen initially on 10/10/2022, but left shortly after triage due to a family emergency.  He states that since then, he has had persistent pain in his lower back, right upper arm, and left ankle.  Denies any significant head injuries.  Denies chest pain, shortness of breath, abdominal pain, hearing changes, vision changes, numbness/tingling in upper or lower extremities, or dizziness/lightheadedness.  History Limitations: No limitations.        Physical Exam  Triage Vital Signs: ED Triage Vitals  Enc Vitals Group     BP 10/14/22 1020 (!) 152/94     Pulse Rate 10/14/22 1020 96     Resp 10/14/22 1020 18     Temp 10/14/22 1020 97.7 F (36.5 C)     Temp src --      SpO2 10/14/22 1020 98 %     Weight 10/14/22 0920 (!) 313 lb 0.9 oz (142 kg)     Height 10/14/22 0920 5\' 8"  (1.727 m)     Head Circumference --      Peak Flow --      Pain Score 10/14/22 0920 4     Pain Loc --      Pain Edu? --      Excl. in [place]? --     Most recent vital signs: Vitals:   10/14/22 1020  BP: (!) 152/94  Pulse: 96  Resp: 18  Temp: 97.7 F (36.5 C)  SpO2: 98%    General: Awake, NAD.  Skin: Warm, dry. No rashes or lesions.  Eyes: PERRL. Conjunctivae normal.  CV: Good peripheral perfusion.  Resp: Normal effort.  Abd: Soft, non-tender. No distention.  Neuro: At baseline. No gross neurological deficits.  Musculoskeletal: Normal ROM of all extremities.  Focused Exam: No gross deformities to the left ankle.  Normal range of motion.  PMS intact distally.  He is still  able to ambulate well on his own.  No gross deformities to the right upper arm.  No osseous tenderness.  Normal range of motion at the shoulder and elbow joints.  PMS intact distally.  No midline spinal tenderness.  Normal range of motion of the spine.  Mild paraspinal muscle tightness in the lumbar region.  Physical Exam    ED Results / Procedures / Treatments  Labs (all labs ordered are listed, but only abnormal results are displayed) Labs Reviewed - No data to display   EKG N/A.    RADIOLOGY  ED Provider Interpretation: N/A.  No results found.  PROCEDURES:  Critical Care performed: N/A.  Procedures    MEDICATIONS ORDERED IN ED: Medications - No data to display   IMPRESSION / MDM / ASSESSMENT AND PLAN / ED COURSE  I reviewed the triage vital signs and the nursing notes.                              Differential diagnosis includes, but is not limited to, lumbar strain, ankle  sprain, malleoli fracture, vertebral fracture.  Assessment/Plan Patient presents with left ankle pain, right upper arm pain, and back pain x 4 days since falling off of a ladder.  He appears well clinically.  No significant findings on physical exam.  I do not believe he would benefit from imaging at this time.  Patient agreed.  He does state that he has had persistent issues with his left ankle.  Will provide him with a referral to podiatry.  In the meantime, we will provide him with a prescription for cyclobenzaprine and Voltaren gel.  He was amenable to this.  Recommend that he follow-up with his primary care provider as needed.  Will discharge  Provided the patient with anticipatory guidance, return precautions, and educational material. Encouraged the patient to return to the emergency department at any time if they begin to experience any new or worsening symptoms. Patient expressed understanding and agreed with the plan.   Patient's presentation is most consistent with acute complicated  illness / injury requiring diagnostic workup.       FINAL CLINICAL IMPRESSION(S) / ED DIAGNOSES   Final diagnoses:  Fall, initial encounter     Rx / DC Orders   ED Discharge Orders          Ordered    cyclobenzaprine (FLEXERIL) 10 MG tablet  3 times daily PRN        10/14/22 1323    diclofenac Sodium (VOLTAREN) 1 % GEL  4 times daily        10/14/22 1323             Note:  This document was prepared using Dragon voice recognition software and may include unintentional dictation errors.   Varney Daily, [place] 10/14/22 1924    Minna Antis, MD 10/15/22 [phone]

## 2022-10-14 NOTE — ED Notes (Signed)
Pt states on the 10/10/22 he heard a pop in his left ankle. Swelling noted to the lateral aspect of it

## 2022-10-14 NOTE — Discharge Instructions (Addendum)
-  You may take the cyclobenzaprine as needed for muscle relaxation, use caution as it may make you dizzy/drowsy.  -You may take Voltaren gel as needed for the pain along your arm and back.  You may take Tylenol as well.  -If your foot/ankle continues to bother you after a few weeks, please follow-up with the podiatrist listed in these instructions.  -Return to the emergency department anytime if you begin to experience any new or worsening symptoms.

## 2022-10-14 NOTE — ED Triage Notes (Signed)
C/O fall on 12/22.  Initially presented on 12/23 for same injury but had to leave due to 'family emergency'.  Presents today for evaluation.    AAx3 skin warm and dry. NAD.  Ambulates without difficulty.

## 2022-10-19 ENCOUNTER — Emergency Department: Payer: Self-pay

## 2022-10-19 ENCOUNTER — Encounter: Payer: Self-pay | Admitting: Emergency Medicine

## 2022-10-19 ENCOUNTER — Other Ambulatory Visit: Payer: Self-pay

## 2022-10-19 ENCOUNTER — Emergency Department
Admission: EM | Admit: 2022-10-19 | Discharge: 2022-10-19 | Disposition: A | Discharge status: Home / Self Care | Payer: Self-pay | Attending: Emergency Medicine | Admitting: Emergency Medicine

## 2022-10-19 DIAGNOSIS — M25572 Pain in left ankle and joints of left foot: Secondary | ICD-10-CM | POA: Insufficient documentation

## 2022-10-19 DIAGNOSIS — B351 Tinea unguium: Secondary | ICD-10-CM | POA: Insufficient documentation

## 2022-10-19 MED ORDER — PREDNISONE 10 MG PO TABS: ORAL_TABLET | ORAL | 0 refills | Status: DC

## 2022-10-19 NOTE — Discharge Instructions (Addendum)
You need to call make an appoint with Dr. Berneice Heinrich who is on-call for podiatry at [facility].  Continue wearing your ankle brace anytime you are up walking on it provide additional support and protection.  And elevation anytime is swelling.  You also have fungal nails and this can also be treated by the podiatrist when you are seen for your ankle injury.

## 2022-10-19 NOTE — ED Provider Notes (Signed)
[facility] Provider Note    Event Date/Time   First MD Initiated Contact with Patient 10/19/22 [phone]     (approximate)   History   Foot Pain   HPI  Rodney Crum. is a 32 y.o. male   presents to the ED with complaint of falling off a ladder approximately 1 week ago and states that he was seen in the emergency department.  At that time he injured his back and his left ankle.  He states that the muscle relaxant helped his back but that he had stopped the meloxicam and naproxen due to stomach upset.  Previous records indicate that patient had a sprained left ankle 08/23/2022, on acute left ankle pain on 09/14/2022, registered in the ED on 10/10/2022 but left without being seen, was seen on 10/14/2022 at which time he was given an ankle splint and anti-inflammatory.      Physical Exam   Triage Vital Signs: ED Triage Vitals  Enc Vitals Group     BP 10/19/22 0722 (!) 162/108     Pulse Rate 10/19/22 0722 99     Resp 10/19/22 0722 20     Temp 10/19/22 0722 98.2 F (36.8 C)     Temp Source 10/19/22 0722 Oral     SpO2 10/19/22 0722 99 %     Weight 10/19/22 0721 (!) 317 lb 7.4 oz (144 kg)     Height 10/19/22 0721 5\' 8"  (1.727 m)     Head Circumference --      Peak Flow --      Pain Score 10/19/22 0721 10     Pain Loc --      Pain Edu? --      Excl. in [place]? --     Most recent vital signs: Vitals:   10/19/22 0722  BP: (!) 162/108  Pulse: 99  Resp: 20  Temp: 98.2 F (36.8 C)  SpO2: 99%     General: Awake, no distress.  CV:  Good peripheral perfusion.  Resp:  Normal effort.  Abd:  No distention.  Other:  Left ankle exam without deformity or soft tissue edema.  No discoloration or abrasions seen.  Pulses are present.  Patient is able to stand and bear weight and ambulate without any assistance.  Fungal nails are noted on multiple toes.  No edema or warmth is noted to the lower extremity and no calf tenderness.   ED Results / Procedures /  Treatments   Labs (all labs ordered are listed, but only abnormal results are displayed) Labs Reviewed - No data to display    RADIOLOGY  X-ray images of the left ankle were reviewed and interpreted by myself independent of the radiologist and was negative for acute bony injury.  Radiology report agrees and mentions that soft tissue is stable in comparison with previous x-rays in November.   PROCEDURES:  Critical Care performed:   Procedures   MEDICATIONS ORDERED IN ED: Medications - No data to display   IMPRESSION / MDM / [place] / ED COURSE  I reviewed the triage vital signs and the nursing notes.   Differential diagnosis includes, but is not limited to, persistent left ankle pain, ankle strain, osteoarthritis, gouty arthritis, possible malingering.  32 year old male presents to the ED with continued complaint of pain to his left ankle.  Patient has been to the emergency department 4 times since November 5 for his left ankle.  Patient states that he can no longer  take meloxicam and Naprosyn due to GI upset and that the Voltaren gel did nothing for his pain.  Physical exam is benign.  X-ray was reassuring.  I discussed the need for a referral to a podiatrist both for his persistent ankle pain and his fungal nails.  Patient voiced that he specifically needs pain medication.  He states he has crutches and splints at home but currently not using any of them.  He is strongly encouraged to wear his splint until he is seen by the podiatrist.  A prescription for prednisone was sent to the pharmacy to help with inflammation and he is strongly encouraged to eat with this medication.      Patient's presentation is most consistent with acute complicated illness / injury requiring diagnostic workup.  FINAL CLINICAL IMPRESSION(S) / ED DIAGNOSES   Final diagnoses:  Left ankle pain, unspecified chronicity  Fungal nail infection     Rx / DC Orders   ED Discharge Orders           Ordered    predniSONE (DELTASONE) 10 MG tablet        10/19/22 0840             Note:  This document was prepared using Dragon voice recognition software and may include unintentional dictation errors.   Johnn Hai, PA-C 10/19/22 1016    Duffy Bruce, MD 10/20/22 2351

## 2022-10-19 NOTE — ED Triage Notes (Signed)
Pt reports a week ago he fell off a ladder and was seen here. Pt reports he had a back injury and and hurt his left foot. Pt reports he asked the MD to check it but the MD told him he did not need an x-ray and that it was fine. Pt reports continues to have pain and swelling and needs an x-ray.

## 2022-11-29 ENCOUNTER — Other Ambulatory Visit: Payer: Self-pay

## 2022-11-29 ENCOUNTER — Encounter: Payer: Self-pay | Admitting: Emergency Medicine

## 2022-11-29 ENCOUNTER — Emergency Department
Admission: EM | Admit: 2022-11-29 | Discharge: 2022-11-29 | Disposition: A | Discharge status: Home / Self Care | Payer: Self-pay | Attending: Emergency Medicine | Admitting: Emergency Medicine

## 2022-11-29 DIAGNOSIS — M5416 Radiculopathy, lumbar region: Secondary | ICD-10-CM | POA: Insufficient documentation

## 2022-11-29 DIAGNOSIS — M545 Low back pain, unspecified: Secondary | ICD-10-CM

## 2022-11-29 MED ORDER — CYCLOBENZAPRINE HCL 10 MG PO TABS: 10.0000 mg | ORAL_TABLET | Freq: Three times a day (TID) | ORAL | 0 refills | Status: DC | PRN

## 2022-11-29 MED ORDER — CYCLOBENZAPRINE HCL 10 MG PO TABS
10.0000 mg | ORAL_TABLET | Freq: Once | ORAL | Status: AC
  Administered 2022-11-29: 10 mg via ORAL
  Filled 2022-11-29: qty 1

## 2022-11-29 MED ORDER — KETOROLAC TROMETHAMINE 30 MG/ML IJ SOLN
30.0000 mg | Freq: Once | INTRAMUSCULAR | Status: AC
  Administered 2022-11-29: 30 mg via INTRAMUSCULAR
  Filled 2022-11-29: qty 1

## 2022-11-29 NOTE — ED Triage Notes (Signed)
Pt in with low back pain. States he was letting the dog out, and almost fell when the dog jolted him forward. He sprang backward to keep from falling, and immediately felt the pain to low back. Pain stretches down to LLE

## 2022-11-29 NOTE — ED Provider Notes (Signed)
[facility] Provider Note   Event Date/Time   First MD Initiated Contact with Patient 11/29/22 [phone]     (approximate) History  Back Pain  HPI Rodney Jones. is a 32 y.o. male with stated past medical history of obesity who presents after acute onset left lumbar back pain after his dog pulled on the leash today.  Patient states that after onset he has been unable to walk without pain as well as had pain radiating down the back of his left leg.  Patient has been ambulatory and states that pain has been stable at 10/10 since onset. ROS: Patient currently denies any vision changes, tinnitus, difficulty speaking, facial droop, sore throat, chest pain, shortness of breath, abdominal pain, nausea/vomiting/diarrhea, dysuria, or weakness/numbness/paresthesias in any extremity   Physical Exam  Triage Vital Signs: ED Triage Vitals  Enc Vitals Group     BP 11/29/22 0615 (!) 157/96     Pulse Rate 11/29/22 0613 92     Resp 11/29/22 0613 20     Temp 11/29/22 0615 98.1 F (36.7 C)     Temp Source 11/29/22 0615 Oral     SpO2 11/29/22 0613 100 %     Weight 11/29/22 0614 260 lb (117.9 kg)     Height --      Head Circumference --      Peak Flow --      Pain Score 11/29/22 0613 10     Pain Loc --      Pain Edu? --      Excl. in [place]? --    Most recent vital signs: Vitals:   11/29/22 0613 11/29/22 0615  BP:  (!) 157/96  Pulse: 92   Resp: 20   Temp:  98.1 F (36.7 C)  SpO2: 100%    General: Awake, oriented x4. CV:  Good peripheral perfusion.  Resp:  Normal effort.  Abd:  No distention.  Other:  Negative straight leg test on the left ED Results / Procedures / Treatments  PROCEDURES: Critical Care performed: No Procedures MEDICATIONS ORDERED IN ED: Medications  ketorolac (TORADOL) 30 MG/ML injection 30 mg (30 mg Intramuscular Given 11/29/22 0645)  cyclobenzaprine (FLEXERIL) tablet 10 mg (10 mg Oral Given 11/29/22 0645)   IMPRESSION / MDM / ASSESSMENT AND PLAN /  ED COURSE  I reviewed the triage vital signs and the nursing notes.                             Patient's presentation is most consistent with acute presentation with potential threat to life or bodily function. Patient presents for low back pain. Given History and Exam the patient appears to be at low risk for Spinal Cord Compression Syndrome, Vertebral Malignancy/Mets, acute Spinal Fracture, Vertebral Osteomyelitis, Epidural Abscess, Infected or Obstructing Kidney Stone.  Their presentation appears most likely to be secondary to non-emergent musculoskeletal etiology vs non-emergent disc herniation.  ED Workup: Defer imaging and labwork for outpatient follow up at this time.  Disposition: Discharge. Strict return precautions discussed with patient with full understanding. Advised patient to follow up promptly with primary care provider   FINAL CLINICAL IMPRESSION(S) / ED DIAGNOSES   Final diagnoses:  Left lumbar pain  Acute left lumbar radiculopathy   Rx / DC Orders   ED Discharge Orders          Ordered    cyclobenzaprine (FLEXERIL) 10 MG tablet  3 times daily PRN  11/29/22 0718           Note:  This document was prepared using Dragon voice recognition software and may include unintentional dictation errors.   Naaman Plummer, MD 11/29/22 [phone]

## 2023-01-13 ENCOUNTER — Other Ambulatory Visit: Payer: Self-pay

## 2023-01-13 ENCOUNTER — Emergency Department
Admission: EM | Admit: 2023-01-13 | Discharge: 2023-01-13 | Disposition: A | Discharge status: Home / Self Care | Payer: Self-pay | Attending: Emergency Medicine | Admitting: Emergency Medicine

## 2023-01-13 ENCOUNTER — Emergency Department: Payer: Self-pay

## 2023-01-13 DIAGNOSIS — W2111XA Struck by baseball bat, initial encounter: Secondary | ICD-10-CM | POA: Insufficient documentation

## 2023-01-13 DIAGNOSIS — S0083XA Contusion of other part of head, initial encounter: Secondary | ICD-10-CM | POA: Insufficient documentation

## 2023-01-13 DIAGNOSIS — Y9364 Activity, baseball: Secondary | ICD-10-CM | POA: Insufficient documentation

## 2023-01-13 DIAGNOSIS — S0993XA Unspecified injury of face, initial encounter: Secondary | ICD-10-CM

## 2023-01-13 MED ORDER — IBUPROFEN 600 MG PO TABS: 600.0000 mg | ORAL_TABLET | Freq: Four times a day (QID) | ORAL | 0 refills | Status: DC | PRN

## 2023-01-13 MED ORDER — AMOXICILLIN-POT CLAVULANATE 875-125 MG PO TABS: 1.0000 | ORAL_TABLET | Freq: Two times a day (BID) | ORAL | 0 refills | Status: AC

## 2023-01-13 MED ORDER — ONDANSETRON 4 MG PO TBDP
4.0000 mg | ORAL_TABLET | Freq: Once | ORAL | Status: AC
  Administered 2023-01-13: 4 mg via ORAL
  Filled 2023-01-13: qty 1

## 2023-01-13 MED ORDER — HYDROCODONE-ACETAMINOPHEN 5-325 MG PO TABS: 1.0000 | ORAL_TABLET | Freq: Four times a day (QID) | ORAL | 0 refills | Status: DC | PRN

## 2023-01-13 MED ORDER — AMOXICILLIN-POT CLAVULANATE 875-125 MG PO TABS: 1.0000 | ORAL_TABLET | Freq: Two times a day (BID) | ORAL | 0 refills | Status: DC

## 2023-01-13 MED ORDER — OXYCODONE-ACETAMINOPHEN 5-325 MG PO TABS
2.0000 | ORAL_TABLET | Freq: Once | ORAL | Status: AC
  Administered 2023-01-13: 2 via ORAL
  Filled 2023-01-13: qty 2
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
## 2023-01-13 NOTE — Discharge Instructions (Signed)
Try to eat softer foods until pain improves  Take the Ibuprofen for moderate pain and the Norco for severe pain  Drink plenty of fluids  Take the antibiotic for possible concomitant dental infection/cavity

## 2023-05-19 ENCOUNTER — Emergency Department: Payer: Self-pay

## 2023-05-19 ENCOUNTER — Other Ambulatory Visit: Payer: Self-pay

## 2023-05-19 ENCOUNTER — Emergency Department
Admission: EM | Admit: 2023-05-19 | Discharge: 2023-05-19 | Disposition: A | Discharge status: Home / Self Care | Payer: Self-pay | Attending: Emergency Medicine | Admitting: Emergency Medicine

## 2023-05-19 DIAGNOSIS — X501XXA Overexertion from prolonged static or awkward postures, initial encounter: Secondary | ICD-10-CM | POA: Insufficient documentation

## 2023-05-19 DIAGNOSIS — M25572 Pain in left ankle and joints of left foot: Secondary | ICD-10-CM | POA: Insufficient documentation

## 2023-05-19 MED ORDER — KETOROLAC TROMETHAMINE 15 MG/ML IJ SOLN
15.0000 mg | Freq: Once | INTRAMUSCULAR | Status: AC
  Administered 2023-05-19: 15 mg via INTRAMUSCULAR
  Filled 2023-05-19: qty 1

## 2023-05-19 NOTE — Discharge Instructions (Signed)
You were seen in the Emergency Department for ankle pain. Your x-ray showed no fractures or dislocations. If you continue to have pain you might need to seek more medical attention and follow up with your primary care doctor or an orthopedist as needed.Your x-ray did not show any evidence of fracture; it is likely that you sprained your ankle. -- Use ice for comfort for 30 minutes 4 or 5 times a day to help reduce pain and swelling. -- Keep your ankle elevated to reduce swelling. -- You may bear weight as tolerated. -- Take Tylenol or Ibuprofen for pain. -- You can use the brace for comfort as well. -- Please follow up with your primary care doctor or orthopedics as needed.

## 2023-05-19 NOTE — ED Triage Notes (Signed)
Pt to ED for fall yesterday when step on camper broke. C/o left ankle pain.  Denies hitting head or LOC

## 2023-05-19 NOTE — ED Provider Notes (Signed)
[facility] Provider Note    Event Date/Time   First MD Initiated Contact with Patient 05/19/23 1229     (approximate)   History   Fall   HPI  Rodney Jones. is a 32 y.o. male with a past medical history of obesity presents today for evaluation of left ankle pain x2 days.  Patient reports that he was stepping down foldable chairs coming out of his trailer when he twisted his ankle 2 days ago.  He reports that he has been able to ambulate, though has pain with ambulation.  His pain is primarily over the lateral aspect of his ankle.  He has not had any paresthesias or weakness.  He has not noticed any open wounds.  He did not strike his head or lose consciousness.  No other injury sustained.  There are no problems to display for this patient.         Physical Exam   Triage Vital Signs: ED Triage Vitals  Encounter Vitals Group     BP 05/19/23 1153 (!) 147/98     Systolic BP Percentile --      Diastolic BP Percentile --      Pulse Rate 05/19/23 1153 89     Resp 05/19/23 1153 18     Temp 05/19/23 1153 98.3 F (36.8 C)     Temp src --      SpO2 05/19/23 1153 100 %     Weight 05/19/23 1151 (!) 316 lb (143.3 kg)     Height 05/19/23 1151 5\' 8"  (1.727 m)     Head Circumference --      Peak Flow --      Pain Score 05/19/23 1151 10     Pain Loc --      Pain Education --      Exclude from Growth Chart --     Most recent vital signs: Vitals:   05/19/23 1153 05/19/23 1308  BP: (!) 147/98 (!) 140/90  Pulse: 89 85  Resp: 18 18  Temp: 98.3 F (36.8 C)   SpO2: 100% 99%    Physical Exam Vitals and nursing note reviewed.  Constitutional:      General: Awake and alert. No acute distress.    Appearance: Normal appearance. The patient is obese.  HENT:     Head: Normocephalic and atraumatic.     Mouth: Mucous membranes are moist.  Eyes:     General: PERRL. Normal EOMs        Right eye: No discharge.        Left eye: No discharge.      Conjunctiva/sclera: Conjunctivae normal.  Cardiovascular:     Rate and Rhythm: Normal rate and regular rhythm.     Pulses: Normal pulses.  Pulmonary:     Effort: Pulmonary effort is normal. No respiratory distress.     Breath sounds: Normal breath sounds.  Abdominal:     Abdomen is soft. There is no abdominal tenderness. No rebound or guarding. No distention. Musculoskeletal:        General: No swelling. Normal range of motion.     Cervical back: Normal range of motion and neck supple.  Left ankle: Tenderness and swelling over the anterior talofibular ligament and lateral ankle, no specific lateral or medial malleolar tenderness or proximal fifth metacarpal tenderness. No proximal fibular tenderness. 2+ pedal pulses with brisk capillary refill. Intact distal sensation and strength with normal ROM. Able to plantar flex and dorsiflex against resistance.  Able to invert and evert against resistance. Negative dorsiflexion external rotation test. Negative squeeze test. Negative Thompson test.  He is ambulatory with a steady gait. Skin:    General: Skin is warm and dry.     Capillary Refill: Capillary refill takes less than 2 seconds.     Findings: No rash.  Neurological:     Mental Status: The patient is awake and alert.      ED Results / Procedures / Treatments   Labs (all labs ordered are listed, but only abnormal results are displayed) Labs Reviewed - No data to display   EKG     RADIOLOGY I independently reviewed and interpreted imaging and agree with radiologists findings.     PROCEDURES:  Critical Care performed:   Procedures   MEDICATIONS ORDERED IN ED: Medications  ketorolac (TORADOL) 15 MG/ML injection 15 mg (15 mg Intramuscular Given 05/19/23 1245)     IMPRESSION / MDM / ASSESSMENT AND PLAN / ED COURSE  I reviewed the triage vital signs and the nursing notes.   Differential diagnosis includes, but is not limited to, fracture, sprain, contusion. Patient has  swelling and tenderness to palpation over lateral malleolus and ATFL.  Therefore per [place] ankle rules x-ray was obtained.  There is no evidence of fracture on x-ray.  There is no tenderness to proximal fibula that would be concerning for occult fracture.  There is no knee pain or swelling and no ligamental laxity, do not suspect knee injury.  There is no proximal fifth metatarsal tenderness concerning for Wolford fracture.  Negative squeeze test so do not suspect high ankle sprain.  Negative Thompson test, do not suspect Achilles tendon rupture. Patient is able to bear weight with pain.  Patient was give ankle splint.  We discussed Rice and orthopedic follow-up if symptoms persist.  Patient was treated symptomatically in the emergency department with improvement of symptoms.  We discussed no sports/exertional activities until ankle heals.  Patient understands and agrees with plan.  He is ambulatory with a steady gait.   Patient's presentation is most consistent with acute complicated illness / injury requiring diagnostic workup.     FINAL CLINICAL IMPRESSION(S) / ED DIAGNOSES   Final diagnoses:  Acute left ankle pain     Rx / DC Orders   ED Discharge Orders     None        Note:  This document was prepared using Dragon voice recognition software and may include unintentional dictation errors.   Jackelyn Hoehn, PA-C 05/19/23 1309    Corena Herter, MD 05/19/23 1616

## 2023-05-19 NOTE — ED Notes (Signed)
Pt verbalizes understanding of discharge instructions. Opportunity for questioning and answers were provided. Pt discharged from ED to home with significant other.   ? ?

## 2023-06-15 ENCOUNTER — Emergency Department
Admission: EM | Admit: 2023-06-15 | Discharge: 2023-06-15 | Disposition: A | Discharge status: Home / Self Care | Payer: Self-pay | Attending: Student in an Organized Health Care Education/Training Program | Admitting: Student in an Organized Health Care Education/Training Program

## 2023-06-15 ENCOUNTER — Emergency Department: Payer: Self-pay

## 2023-06-15 ENCOUNTER — Other Ambulatory Visit: Payer: Self-pay

## 2023-06-15 DIAGNOSIS — S9002XA Contusion of left ankle, initial encounter: Secondary | ICD-10-CM | POA: Insufficient documentation

## 2023-06-15 DIAGNOSIS — W208XXA Other cause of strike by thrown, projected or falling object, initial encounter: Secondary | ICD-10-CM | POA: Insufficient documentation

## 2023-06-15 DIAGNOSIS — T148XXA Other injury of unspecified body region, initial encounter: Secondary | ICD-10-CM

## 2023-06-15 DIAGNOSIS — Y9389 Activity, other specified: Secondary | ICD-10-CM | POA: Insufficient documentation

## 2023-06-15 DIAGNOSIS — S9032XA Contusion of left foot, initial encounter: Secondary | ICD-10-CM | POA: Insufficient documentation

## 2023-06-15 DIAGNOSIS — I10 Essential (primary) hypertension: Secondary | ICD-10-CM | POA: Insufficient documentation

## 2023-06-15 DIAGNOSIS — M7989 Other specified soft tissue disorders: Secondary | ICD-10-CM | POA: Insufficient documentation

## 2023-06-15 DIAGNOSIS — Z23 Encounter for immunization: Secondary | ICD-10-CM | POA: Insufficient documentation

## 2023-06-15 MED ORDER — HYDROCODONE-ACETAMINOPHEN 5-325 MG PO TABS
1.0000 | ORAL_TABLET | Freq: Once | ORAL | Status: AC
  Administered 2023-06-15: 1 via ORAL
  Filled 2023-06-15: qty 1

## 2023-06-15 MED ORDER — OXYCODONE-ACETAMINOPHEN 5-325 MG PO TABS: 1.0000 | ORAL_TABLET | Freq: Four times a day (QID) | ORAL | 0 refills | Status: DC | PRN

## 2023-06-15 MED ORDER — TETANUS-DIPHTH-ACELL PERTUSSIS 5-2.5-18.5 LF-MCG/0.5 IM SUSY
0.5000 mL | PREFILLED_SYRINGE | Freq: Once | INTRAMUSCULAR | Status: AC
  Administered 2023-06-15: 0.5 mL via INTRAMUSCULAR
  Filled 2023-06-15: qty 0.5

## 2023-06-15 MED ORDER — HYDROCODONE-ACETAMINOPHEN 5-325 MG PO TABS: 1.0000 | ORAL_TABLET | Freq: Four times a day (QID) | ORAL | 0 refills | Status: DC | PRN

## 2023-06-15 MED ORDER — AMLODIPINE BESYLATE 5 MG PO TABS: 5.0000 mg | ORAL_TABLET | Freq: Every day | ORAL | 3 refills | Status: DC

## 2023-06-15 NOTE — ED Notes (Signed)
See triage note  Presents with pain to top of   right foot  States he dropped a cider block on foot 2 days ago  Abrasion noted to foot with swelling

## 2023-06-15 NOTE — ED Notes (Signed)
Abrasion to dorsum of L foot covered with bandaid. CAM boot applied to L foot. Pt educated on RICE care for orthopedic injuries. Pt also educated in wound care to promote healing of abrasion.

## 2023-06-15 NOTE — Discharge Instructions (Addendum)
Clean abrasion daily with mild soap and water allowed to dry completely before putting it bandage on it.  Wear the cam walker boot until you are able to stand and walk without having pain.  Ice and elevation as needed for swelling.  Take ibuprofen as needed for inflammation.  A prescription for hydrocodone was sent to the pharmacy as needed for moderate to severe pain.  Do not drive or operate machinery while taking this medication.  A prescription for blood pressure medication was sent to the pharmacy as your blood pressure has been elevated with the bottom number being over 100 each time you have been in the emergency department.  It is extremely important that you obtain a primary care provider either your family's doctor or 1 listed on your discharge papers.  Please go to the following website to schedule new (and existing) patient appointments:   [email or web address]   The following is a list of primary care offices in the area who are accepting new patients at this time.  Please reach out to one of them directly and let them know you would like to schedule an appointment to follow up on an Emergency Department visit, and/or to establish a new primary care provider (PCP).  There are likely other primary care clinics in the are who are accepting new patients, but this is an excellent place to start:  [facility] Lead physician: Dr Shirlee Latch [address] [place], [place] [address] [phone]  [facility] Lead Physician: Dr Alba Cory [address], [place], [place] [address] [phone]  [facility]  Lead Physician: Dr Olevia Perches [address] [place], [place] [address] [phone]  [facility] Lead Physician: Dr Sofie Hartigan [address] [place], [place] [address] [phone]  [facility] Primary Care & Sports Medicine at [facility] Lead Physician: Dr Bari Edward [address] [place], [place], [place] [address] [phone]

## 2023-06-15 NOTE — ED Provider Notes (Signed)
[facility] Provider Note    Event Date/Time   First MD Initiated Contact with Patient 06/15/23 [phone]     (approximate)   History   Ankle Pain   HPI  Rodney Romack. is a 32 y.o. male presents to the ED with complaint of left foot pain.  Patient states that 2 days ago he was helping someone move cinderblocks and 1 fell off the table striking his left ankle and hitting his foot.  He has continued to ambulate since that time but has become more painful and today slightly swollen.  Patient is uncertain of the last tetanus update.  Also initial blood pressure in triage was elevated and patient does not take blood pressure medication nor has he been diagnosed with hypertension.  He does have positive family history.     Physical Exam   Triage Vital Signs: ED Triage Vitals [06/15/23 0909]  Encounter Vitals Group     BP (!) 182/125     Systolic BP Percentile      Diastolic BP Percentile      Pulse Rate (!) 103     Resp 18     Temp 98.2 F (36.8 C)     Temp src      SpO2 96 %     Weight (!) 315 lb 4.1 oz (143 kg)     Height      Head Circumference      Peak Flow      Pain Score 10     Pain Loc      Pain Education      Exclude from Growth Chart     Most recent vital signs: Vitals:   06/15/23 0909 06/15/23 0951  BP: (!) 182/125 (!) 162/115  Pulse: (!) 103   Resp: 18   Temp: 98.2 F (36.8 C)   SpO2: 96%      General: Awake, no distress.  CV:  Good peripheral perfusion.  Regular rate and rhythm. Resp:  Normal effort.  Lungs are clear bilaterally. Abd:  No distention.  Other:  Left ankle without deformity or noted edema.  Bilaterally ankle is moderately tender to palpation.  On the dorsal aspect of his left foot there is a superficial abrasion mostly over the first metatarsal midshaft.  Motor sensory function and capillary fill is intact distally to the injury.   ED Results / Procedures / Treatments   Labs (all labs ordered are listed,  but only abnormal results are displayed) Labs Reviewed - No data to display    RADIOLOGY X-ray images of the left foot and ankle were reviewed and interpreted by myself independent of the radiologist and no acute bony abnormalities noted.    PROCEDURES:  Critical Care performed:   Procedures   MEDICATIONS ORDERED IN ED: Medications  Tdap (BOOSTRIX) injection 0.5 mL (0.5 mLs Intramuscular Given 06/15/23 0946)  HYDROcodone-acetaminophen (NORCO/VICODIN) 5-325 MG per tablet 1 tablet (1 tablet Oral Given 06/15/23 0953)     IMPRESSION / MDM / ASSESSMENT AND PLAN / ED COURSE  I reviewed the triage vital signs and the nursing notes.   Differential diagnosis includes, but is not limited to, contusion left foot/ankle, fracture, abrasion left foot, dislocation.  32 year old male presents to the ED with complaint of left foot and ankle pain after an accident that occurred 2 days ago.  Tetanus booster was given for open wound dorsal aspect of the left foot.  Patient was given hydrocodone while in the emergency department.  Blood pressure was elevated in triage and prior to discharge was 162/115.  Patient has no symptoms.  We discussed the importance of having this checked and getting a PCP as he may need to be on blood pressure medication.      Patient's presentation is most consistent with acute complicated illness / injury requiring diagnostic workup.  FINAL CLINICAL IMPRESSION(S) / ED DIAGNOSES   Final diagnoses:  Contusion of left foot, initial encounter  Contusion of left ankle, initial encounter  Abrasion  Hypertension, uncontrolled     Rx / DC Orders   ED Discharge Orders          Ordered    HYDROcodone-acetaminophen (NORCO/VICODIN) 5-325 MG tablet  Every 6 hours PRN        06/15/23 1110    amLODipine (NORVASC) 5 MG tablet  Daily        06/15/23 1110             Note:  This document was prepared using Dragon voice recognition software and may include  unintentional dictation errors.   Tommi Rumps, PA-C 06/15/23 1117    Willy Eddy, MD 06/15/23 1146

## 2023-06-15 NOTE — ED Triage Notes (Signed)
Pt to ED for dropping heavy block on left foot 2 days ago/ ambulatory

## 2023-12-01 ENCOUNTER — Other Ambulatory Visit: Payer: Self-pay

## 2023-12-01 ENCOUNTER — Emergency Department
Admission: EM | Admit: 2023-12-01 | Discharge: 2023-12-01 | Disposition: A | Discharge status: Home / Self Care | Payer: Self-pay | Attending: Student in an Organized Health Care Education/Training Program | Admitting: Student in an Organized Health Care Education/Training Program

## 2023-12-01 DIAGNOSIS — J45909 Unspecified asthma, uncomplicated: Secondary | ICD-10-CM | POA: Insufficient documentation

## 2023-12-01 DIAGNOSIS — I10 Essential (primary) hypertension: Secondary | ICD-10-CM | POA: Insufficient documentation

## 2023-12-01 DIAGNOSIS — L02811 Cutaneous abscess of head [any part, except face]: Secondary | ICD-10-CM | POA: Insufficient documentation

## 2023-12-01 DIAGNOSIS — Z91199 Patient's noncompliance with other medical treatment and regimen due to unspecified reason: Secondary | ICD-10-CM | POA: Insufficient documentation

## 2023-12-01 HISTORY — DX: Essential (primary) hypertension: I10

## 2023-12-01 MED ORDER — CLINDAMYCIN HCL 150 MG PO CAPS: 300.0000 mg | ORAL_CAPSULE | Freq: Three times a day (TID) | ORAL | 0 refills | Status: DC

## 2023-12-01 MED ORDER — AMLODIPINE BESYLATE 5 MG PO TABS: 5.0000 mg | ORAL_TABLET | Freq: Every day | ORAL | 3 refills | Status: DC

## 2023-12-01 MED ORDER — AMLODIPINE BESYLATE 5 MG PO TABS: 5.0000 mg | ORAL_TABLET | Freq: Every day | ORAL | 0 refills | Status: AC

## 2023-12-01 MED ORDER — AMLODIPINE BESYLATE 5 MG PO TABS: 5.0000 mg | ORAL_TABLET | Freq: Every day | ORAL | 3 refills | Status: AC

## 2023-12-01 MED ORDER — HYDROCODONE-ACETAMINOPHEN 5-325 MG PO TABS
1.0000 | ORAL_TABLET | Freq: Once | ORAL | Status: AC
  Administered 2023-12-01: 1 via ORAL
  Filled 2023-12-01: qty 1

## 2023-12-01 MED ORDER — HYDROCODONE-ACETAMINOPHEN 5-325 MG PO TABS: 1.0000 | ORAL_TABLET | Freq: Four times a day (QID) | ORAL | 0 refills | Status: DC | PRN

## 2023-12-01 MED ORDER — AMLODIPINE BESYLATE 5 MG PO TABS
5.0000 mg | ORAL_TABLET | Freq: Once | ORAL | Status: AC
  Administered 2023-12-01: 5 mg via ORAL
  Filled 2023-12-01: qty 1

## 2023-12-01 NOTE — ED Triage Notes (Signed)
Pt comes with head and neck pain. Pt states this started two days ago. Pt states he can't sleep and painful to bend over. Pt states he was here in past for this and was suppose to have some type of surgery possibly but it never happened. Pt states he had some knots on his head.

## 2023-12-01 NOTE — Discharge Instructions (Signed)
A list of medical clinics is listed on your discharge papers.  You will need to call and make an appointment with 1 of these clinics as you have high blood pressure which needs to be managed.  Medication was sent to the pharmacy for you to begin taking 1 daily and your blood pressure will need to be monitored to see if it is bringing her blood pressure down.  High blood pressure if left untreated can result in stroke or heart attack along with damage to your kidneys and eyes. Pain medication and an antibiotic was sent to the pharmacy.  Also use warm moist compresses to the back of your neck.  Please go to the following website to schedule new (and existing) patient appointments:   [email or web address]   The following is a list of primary care offices in the area who are accepting new patients at this time.  Please reach out to one of them directly and let them know you would like to schedule an appointment to follow up on an Emergency Department visit, and/or to establish a new primary care provider (PCP).  There are likely other primary care clinics in the are who are accepting new patients, but this is an excellent place to start:  [facility] Lead physician: Dr Shirlee Latch [address] [place], [place] [address] [phone]  [facility] Lead Physician: Dr Alba Cory [address], [place], [place] [address] [phone]  [facility]  Lead Physician: Dr Olevia Perches [address] [place], [place] [address] [phone]  [facility] Lead Physician: Dr Sofie Hartigan [address], [place], [place] [address] [phone]  [facility] Primary Care & Sports Medicine at [facility] Lead Physician: Dr Bari Edward [address] [place], [place], [place] [address] [phone]

## 2023-12-01 NOTE — ED Provider Notes (Signed)
[facility] Provider Note    Event Date/Time   First MD Initiated Contact with Patient 12/01/23 0945     (approximate)   History   Headache and Neck Pain   HPI  Rodney Jones. is a 33 y.o. male   presents to the ED with complaint of pain at the base of his scalp and upper neck.  Patient has had a skin infection in this area in the past and states that the antibiotic given to him at that time made a big improvement.  Also looking through his record his blood pressure has remained elevated.  He was written a prescription for amlodipine in August 2024 which she states he did not pick up at the pharmacy at the same time he picked up his other medication.  There is positive family history of hypertension.  Patient has a history of asthma.      Physical Exam   Triage Vital Signs: ED Triage Vitals [12/01/23 0920]  Encounter Vitals Group     BP (!) 164/117     Systolic BP Percentile      Diastolic BP Percentile      Pulse Rate 97     Resp 18     Temp 98 F (36.7 C)     Temp src      SpO2 97 %     Weight (!) 314 lb (142.4 kg)     Height 5\' 8"  (1.727 m)     Head Circumference      Peak Flow      Pain Score 10     Pain Loc      Pain Education      Exclude from Growth Chart     Most recent vital signs: Vitals:   12/01/23 0920 12/01/23 1144  BP: (!) 164/117 (!) 133/99  Pulse: 97 96  Resp: 18 18  Temp: 98 F (36.7 C)   SpO2: 97% 97%     General: Awake, no distress.  Alert, talkative, nontoxic in appearance. CV:  Good peripheral perfusion.  Resp:  Normal effort.  Abd:  No distention.  Other:  Posterior scalp without erythema.  There is a soft tissue area with no localized fluctuance.  Area is tender.   ED Results / Procedures / Treatments   Labs (all labs ordered are listed, but only abnormal results are displayed) Labs Reviewed - No data to display    PROCEDURES:  Critical Care performed:   Procedures   MEDICATIONS ORDERED  IN ED: Medications  amLODipine (NORVASC) tablet 5 mg (5 mg Oral Given 12/01/23 1055)  HYDROcodone-acetaminophen (NORCO/VICODIN) 5-325 MG per tablet 1 tablet (1 tablet Oral Given 12/01/23 1055)     IMPRESSION / MDM / ASSESSMENT AND PLAN / ED COURSE  I reviewed the triage vital signs and the nursing notes.   Differential diagnosis includes, but is not limited to, hypertension uncontrolled, medically noncompliant, skin abscess, cellulitis, folliculitis barbae.  33 year old male presents to the ED with a area to his scalp that is tender and swollen.  He has been seen for this in the emergency department for and states that the antibiotic that he was given at that time resolved his issues.  He has been seen multiple times in the emergency department and each time his blood pressure has been elevated.  I personally saw him in August 2024 at which time I have placed him on amlodipine 5 mg and he was instructed to call make an appointment  for a primary care provider.  Patient did not pick up the amlodipine and has not obtained a PCP since that time.  I explained to him the dangers of having uncontrolled, untreated hypertension.  He was given Norvasc while in the emergency department along with 1 hydrocodone for his pain.  Patient was strongly urged to get the appointment and also a referral for a PCP was placed in epic.  We discussed potential bad outcomes from untreated hypertension.  Prescription for clindamycin, hydrocodone and a 90-day supply of amlodipine was sent to the pharmacy.      Patient's presentation is most consistent with acute illness / injury with system symptoms.  FINAL CLINICAL IMPRESSION(S) / ED DIAGNOSES   Final diagnoses:  Abscess, scalp  Hypertension, uncontrolled  Medically noncompliant     Rx / DC Orders   ED Discharge Orders          Ordered    amLODipine (NORVASC) 5 MG tablet  Daily,   Status:  Discontinued        12/01/23 1205    amLODipine (NORVASC) 5 MG tablet   Daily,   Status:  Discontinued        12/01/23 1205    Ambulatory Referral to Primary Care (Establish Care)       Comments: Hypertension uncontrolled   12/01/23 1201    HYDROcodone-acetaminophen (NORCO/VICODIN) 5-325 MG tablet  Every 6 hours PRN        12/01/23 1204    clindamycin (CLEOCIN) 150 MG capsule  3 times daily        12/01/23 1204    amLODipine (NORVASC) 5 MG tablet  Daily,   Status:  Discontinued        12/01/23 1218    amLODipine (NORVASC) 5 MG tablet  Daily,   Status:  Discontinued        12/01/23 1219    amLODipine (NORVASC) 5 MG tablet  Daily,   Status:  Discontinued        12/01/23 1221    amLODipine (NORVASC) 5 MG tablet  Daily        12/01/23 1223    amLODipine (NORVASC) 5 MG tablet  Daily        12/01/23 1224             Note:  This document was prepared using Dragon voice recognition software and may include unintentional dictation errors.   Tommi Rumps, PA-C 12/01/23 1317    Willy Eddy, MD 12/01/23 1400

## 2023-12-05 ENCOUNTER — Encounter: Payer: Self-pay | Admitting: Emergency Medicine

## 2023-12-05 ENCOUNTER — Emergency Department
Admission: EM | Admit: 2023-12-05 | Discharge: 2023-12-05 | Disposition: A | Discharge status: Home / Self Care | Payer: Self-pay | Attending: Emergency Medicine | Admitting: Emergency Medicine

## 2023-12-05 ENCOUNTER — Other Ambulatory Visit: Payer: Self-pay

## 2023-12-05 ENCOUNTER — Emergency Department: Payer: Self-pay

## 2023-12-05 DIAGNOSIS — L0211 Cutaneous abscess of neck: Secondary | ICD-10-CM | POA: Insufficient documentation

## 2023-12-05 DIAGNOSIS — L03221 Cellulitis of neck: Secondary | ICD-10-CM | POA: Insufficient documentation

## 2023-12-05 DIAGNOSIS — D72829 Elevated white blood cell count, unspecified: Secondary | ICD-10-CM | POA: Insufficient documentation

## 2023-12-05 LAB — CBC WITH DIFFERENTIAL/PLATELET
Abs Immature Granulocytes: 0.05 10*3/uL (ref 0.00–0.07)
Basophils Absolute: 0 10*3/uL (ref 0.0–0.1)
Basophils Relative: 0 %
Eosinophils Absolute: 0.1 10*3/uL (ref 0.0–0.5)
Eosinophils Relative: 0 %
HCT: 47.5 % (ref 39.0–52.0)
Hemoglobin: 15.3 g/dL (ref 13.0–17.0)
Immature Granulocytes: 0 %
Lymphocytes Relative: 24 %
Lymphs Abs: 3.3 10*3/uL (ref 0.7–4.0)
MCH: 27.9 pg (ref 26.0–34.0)
MCHC: 32.2 g/dL (ref 30.0–36.0)
MCV: 86.5 fL (ref 80.0–100.0)
Monocytes Absolute: 0.6 10*3/uL (ref 0.1–1.0)
Monocytes Relative: 4 %
Neutro Abs: 10.1 10*3/uL — ABNORMAL HIGH (ref 1.7–7.7)
Neutrophils Relative %: 72 %
Platelets: 272 10*3/uL (ref 150–400)
RBC: 5.49 MIL/uL (ref 4.22–5.81)
RDW: 11.8 % (ref 11.5–15.5)
WBC: 14.2 10*3/uL — ABNORMAL HIGH (ref 4.0–10.5)
nRBC: 0 % (ref 0.0–0.2)

## 2023-12-05 LAB — BASIC METABOLIC PANEL
Anion gap: 11 (ref 5–15)
BUN: 8 mg/dL (ref 6–20)
CO2: 23 mmol/L (ref 22–32)
Calcium: 9.3 mg/dL (ref 8.9–10.3)
Chloride: 102 mmol/L (ref 98–111)
Creatinine, Ser: 0.74 mg/dL (ref 0.61–1.24)
GFR, Estimated: >60 mL/min (ref >60)
Glucose, Bld: 242 mg/dL — ABNORMAL HIGH (ref 70–99)
Potassium: 3.9 mmol/L (ref 3.5–5.1)
Sodium: 136 mmol/L (ref 135–145)

## 2023-12-05 MED ORDER — SULFAMETHOXAZOLE-TRIMETHOPRIM 800-160 MG PO TABS: 1.0000 | ORAL_TABLET | Freq: Two times a day (BID) | ORAL | 0 refills | Status: AC

## 2023-12-05 MED ORDER — FENTANYL CITRATE PF 50 MCG/ML IJ SOSY
50.0000 ug | PREFILLED_SYRINGE | Freq: Once | INTRAMUSCULAR | Status: AC
  Administered 2023-12-05: 50 ug via INTRAVENOUS
  Filled 2023-12-05: qty 1

## 2023-12-05 MED ORDER — IOHEXOL 300 MG/ML  SOLN
75.0000 mL | Freq: Once | INTRAMUSCULAR | Status: AC | PRN
  Administered 2023-12-05: 75 mL via INTRAVENOUS

## 2023-12-05 MED ORDER — OXYCODONE HCL 5 MG PO TABS: 5.0000 mg | ORAL_TABLET | Freq: Three times a day (TID) | ORAL | 0 refills | Status: DC | PRN

## 2023-12-05 MED ORDER — SULFAMETHOXAZOLE-TRIMETHOPRIM 800-160 MG PO TABS
1.0000 | ORAL_TABLET | Freq: Once | ORAL | Status: AC
  Administered 2023-12-05: 1 via ORAL
  Filled 2023-12-05: qty 1

## 2023-12-05 MED ORDER — OXYCODONE-ACETAMINOPHEN 5-325 MG PO TABS
1.0000 | ORAL_TABLET | Freq: Once | ORAL | Status: AC
  Administered 2023-12-05: 1 via ORAL
  Filled 2023-12-05: qty 1

## 2023-12-05 MED ORDER — LIDOCAINE HCL (PF) 1 % IJ SOLN
5.0000 mL | Freq: Once | INTRAMUSCULAR | Status: AC
  Administered 2023-12-05: 5 mL via INTRADERMAL
  Filled 2023-12-05: qty 5

## 2023-12-05 NOTE — ED Notes (Signed)
See triage note  Presents with possible area noted to back of neck  States he thought was getting better  then area was getting better

## 2023-12-05 NOTE — ED Provider Notes (Addendum)
[facility] Provider Note    Event Date/Time   First MD Initiated Contact with Patient 12/05/23 1102     (approximate)   History   Abscess   HPI Rodney Jones. is a 33 y.o. male presenting today for possible abscess.  Patient states pain and swelling to the back of his neck and head.  He was seen in the ED recently and diagnosed with cellulitis to the area and started on clindamycin.  He feels the pain and swelling is gotten worse on the back of his head and neck.  He had 1 spot of pus drainage.  He is taking all his medications as prescribed.  Denies any fevers or chills.  No pain elsewhere.     Physical Exam   Triage Vital Signs: ED Triage Vitals [12/05/23 1025]  Encounter Vitals Group     BP (!) 158/108     Systolic BP Percentile      Diastolic BP Percentile      Pulse Rate 97     Resp 18     Temp 98.8 F (37.1 C)     Temp Source Oral     SpO2 98 %     Weight (!) 316 lb (143.3 kg)     Height 5\' 8"  (1.727 m)     Head Circumference      Peak Flow      Pain Score 10     Pain Loc      Pain Education      Exclude from Growth Chart     Most recent vital signs: Vitals:   12/05/23 1025 12/05/23 1611  BP: (!) 158/108 (!) 150/100  Pulse: 97 90  Resp: 18 18  Temp: 98.8 F (37.1 C) 98 F (36.7 C)  SpO2: 98% 98%   I have reviewed the vital signs. General:  Awake, alert, no acute distress. Head:  Normocephalic, Atraumatic. EENT:  PERRL, EOMI, Oral mucosa pink and moist, Neck is supple.  Swelling with tenderness palpation noted to the posterior neck at the base of the skull.  1 spot on the superior scalp with site of recent pus drainage. Cardiovascular: Regular rate, 2+ distal pulses. Respiratory:  Normal respiratory effort, symmetrical expansion, no distress.   Extremities:  Moving all four extremities through full ROM without pain.   Neuro:  Alert and oriented.  Interacting appropriately.   Skin:  Warm, dry, no rash.   Psych:  Appropriate affect.    ED Results / Procedures / Treatments   Labs (all labs ordered are listed, but only abnormal results are displayed) Labs Reviewed  CBC WITH DIFFERENTIAL/PLATELET - Abnormal; Notable for the following components:      Result Value   WBC 14.2 (*)    Neutro Abs 10.1 (*)    All other components within normal limits  BASIC METABOLIC PANEL - Abnormal; Notable for the following components:   Glucose, Bld 242 (*)    All other components within normal limits     EKG    RADIOLOGY Independently interpreted CT soft tissue neck with evidence of abscess and surrounding cellulitis   PROCEDURES:  Critical Care performed: No  .Incision and Drainage  Date/Time: 12/05/2023 5:16 PM  Performed by: Janith Lima, MD Authorized by: Janith Lima, MD   Consent:    Consent obtained:  Verbal   Consent given by:  Patient   Risks discussed:  Bleeding, incomplete drainage and infection   Alternatives discussed:  No treatment  Universal protocol:    Patient identity confirmed:  Verbally with patient Location:    Type:  Abscess   Size:  1.7 x 2.1 x 1.6cm   Location:  Neck   Neck location:  R posterior Pre-procedure details:    Skin preparation:  Antiseptic wash Sedation:    Sedation type:  None Anesthesia:    Anesthesia method:  Local infiltration   Local anesthetic:  Lidocaine 1% w/o epi Procedure type:    Complexity:  Complex Procedure details:    Ultrasound guidance: yes     Needle aspiration: no     Incision types:  Single straight   Incision depth:  Dermal   Wound management:  Probed and deloculated   Drainage:  Purulent and bloody   Drainage amount:  Scant   Wound treatment:  Wound left open   Packing materials:  None Post-procedure details:    Procedure completion:  Tolerated well, no immediate complications    MEDICATIONS ORDERED IN ED: Medications  sulfamethoxazole-trimethoprim (BACTRIM DS) 800-160 MG per tablet 1 tablet (has no  administration in time range)  oxyCODONE-acetaminophen (PERCOCET/ROXICET) 5-325 MG per tablet 1 tablet (1 tablet Oral Given 12/05/23 1131)  iohexol (OMNIPAQUE) 300 MG/ML solution 75 mL (75 mLs Intravenous Contrast Given 12/05/23 1342)  fentaNYL (SUBLIMAZE) injection 50 mcg (50 mcg Intravenous Given 12/05/23 1616)  lidocaine (PF) (XYLOCAINE) 1 % injection 5 mL (5 mLs Intradermal Given 12/05/23 1615)     IMPRESSION / MDM / ASSESSMENT AND PLAN / ED COURSE  I reviewed the triage vital signs and the nursing notes.                              Differential diagnosis includes, but is not limited to, neck abscess, cellulitis  Patient's presentation is most consistent with acute complicated illness / injury requiring diagnostic workup.  Patient is a 33 year old male presenting today for neck abscess with pain.  Seen in the ED several days ago and discharged on oral antibiotics.  No improvement in symptoms.  Vital signs are stable on arrival.  No significant cellulitis extending around the neck but is tender with likely spot of abscess.  CT soft tissue neck was ordered.  Patient had mild leukocytosis.  There is evidence of a small abscess midline in the posterior neck.  Reevaluated with bedside ultrasound and was able to drain at the bedside.  It is unclear if patient truly failed outpatient antibiotics or simply had continuation of the abscess that was not previously drained.  I offered admission for IV antibiotics but patient stated he had a funeral tomorrow and needed to go home.  Will change his antibiotic to Bactrim at this time as he did not have true source control the first time, I do not think we can fully stay today failed oral antibiotics.  Told him to return in the next 2 days if symptoms continue to worsen as he may need IV antibiotics at that time.  Patient agreeable with plan.  Clinical Course as of 12/05/23 1713  Sun Dec 05, 2023  1141 No discernible abscess on bedside ultrasound but do note  significant cobblestoning throughout entire posterior neck as well as right side of neck.  Will get CT soft tissue of the neck given that he is already on oral antibiotics to evaluate for spread [DW]    Clinical Course User Index [DW] Janith Lima, MD     FINAL CLINICAL IMPRESSION(S) / ED  DIAGNOSES   Final diagnoses:  Neck abscess  Cellulitis of neck     Rx / DC Orders   ED Discharge Orders          Ordered    sulfamethoxazole-trimethoprim (BACTRIM DS) 800-160 MG tablet  2 times daily        12/05/23 1653    oxyCODONE (ROXICODONE) 5 MG immediate release tablet  Every 8 hours PRN        12/05/23 1653             Note:  This document was prepared using Dragon voice recognition software and may include unintentional dictation errors.   Janith Lima, MD 12/05/23 1715    Janith Lima, MD 12/05/23 [id number]

## 2023-12-05 NOTE — ED Triage Notes (Signed)
Patient to ED via POV for abscess to the back of neck and head. On antibiotic for same. States it was starting to get better but now seems to get worse again.

## 2023-12-05 NOTE — Discharge Instructions (Addendum)
Please pick up the antibiotic from the pharmacy and take as prescribed.  Please return in the next 2 days if pain symptoms seem to worsen or you start to develop fevers.  You may need to be started on IV antibiotics at that time.

## 2023-12-17 ENCOUNTER — Emergency Department
Admission: EM | Admit: 2023-12-17 | Discharge: 2023-12-17 | Disposition: A | Discharge status: Home / Self Care | Payer: Self-pay | Attending: Emergency Medicine | Admitting: Emergency Medicine

## 2023-12-17 ENCOUNTER — Emergency Department: Payer: Self-pay

## 2023-12-17 DIAGNOSIS — J45909 Unspecified asthma, uncomplicated: Secondary | ICD-10-CM | POA: Insufficient documentation

## 2023-12-17 DIAGNOSIS — L03221 Cellulitis of neck: Secondary | ICD-10-CM | POA: Insufficient documentation

## 2023-12-17 DIAGNOSIS — I10 Essential (primary) hypertension: Secondary | ICD-10-CM | POA: Insufficient documentation

## 2023-12-17 DIAGNOSIS — M542 Cervicalgia: Secondary | ICD-10-CM

## 2023-12-17 LAB — BASIC METABOLIC PANEL
Anion gap: 11 (ref 5–15)
BUN: 7 mg/dL (ref 6–20)
CO2: 26 mmol/L (ref 22–32)
Calcium: 9.4 mg/dL (ref 8.9–10.3)
Chloride: 98 mmol/L (ref 98–111)
Creatinine, Ser: 0.84 mg/dL (ref 0.61–1.24)
GFR, Estimated: >60 mL/min (ref >60)
Glucose, Bld: 219 mg/dL — ABNORMAL HIGH (ref 70–99)
Potassium: 4 mmol/L (ref 3.5–5.1)
Sodium: 135 mmol/L (ref 135–145)

## 2023-12-17 LAB — CBC WITH DIFFERENTIAL/PLATELET
Abs Immature Granulocytes: 0.07 10*3/uL (ref 0.00–0.07)
Basophils Absolute: 0.1 10*3/uL (ref 0.0–0.1)
Basophils Relative: 0 %
Eosinophils Absolute: 0.1 10*3/uL (ref 0.0–0.5)
Eosinophils Relative: 1 %
HCT: 48.3 % (ref 39.0–52.0)
Hemoglobin: 15.8 g/dL (ref 13.0–17.0)
Immature Granulocytes: 0 %
Lymphocytes Relative: 24 %
Lymphs Abs: 4.2 10*3/uL — ABNORMAL HIGH (ref 0.7–4.0)
MCH: 28.2 pg (ref 26.0–34.0)
MCHC: 32.7 g/dL (ref 30.0–36.0)
MCV: 86.1 fL (ref 80.0–100.0)
Monocytes Absolute: 0.8 10*3/uL (ref 0.1–1.0)
Monocytes Relative: 4 %
Neutro Abs: 12.3 10*3/uL — ABNORMAL HIGH (ref 1.7–7.7)
Neutrophils Relative %: 71 %
Platelets: 291 10*3/uL (ref 150–400)
RBC: 5.61 MIL/uL (ref 4.22–5.81)
RDW: 11.9 % (ref 11.5–15.5)
WBC: 17.5 10*3/uL — ABNORMAL HIGH (ref 4.0–10.5)
nRBC: 0 % (ref 0.0–0.2)

## 2023-12-17 MED ORDER — OXYCODONE-ACETAMINOPHEN 7.5-325 MG PO TABS: 1.0000 | ORAL_TABLET | ORAL | 0 refills | Status: DC | PRN

## 2023-12-17 MED ORDER — IOHEXOL 300 MG/ML  SOLN
75.0000 mL | Freq: Once | INTRAMUSCULAR | Status: AC | PRN
  Administered 2023-12-17: 75 mL via INTRAVENOUS

## 2023-12-17 MED ORDER — CEPHALEXIN 500 MG PO CAPS: 500.0000 mg | ORAL_CAPSULE | Freq: Four times a day (QID) | ORAL | 0 refills | Status: AC

## 2023-12-17 MED ORDER — DOXYCYCLINE HYCLATE 100 MG PO TABS: 100.0000 mg | ORAL_TABLET | Freq: Two times a day (BID) | ORAL | 0 refills | Status: DC

## 2023-12-17 MED ORDER — ONDANSETRON HCL 4 MG/2ML IJ SOLN
4.0000 mg | Freq: Once | INTRAMUSCULAR | Status: AC
  Administered 2023-12-17: 4 mg via INTRAVENOUS
  Filled 2023-12-17: qty 2

## 2023-12-17 MED ORDER — CEPHALEXIN 500 MG PO CAPS: 500.0000 mg | ORAL_CAPSULE | Freq: Four times a day (QID) | ORAL | 0 refills | Status: DC

## 2023-12-17 MED ORDER — MORPHINE SULFATE (PF) 4 MG/ML IV SOLN
4.0000 mg | Freq: Once | INTRAVENOUS | Status: AC
  Administered 2023-12-17: 4 mg via INTRAVENOUS
  Filled 2023-12-17: qty 1

## 2023-12-17 MED ORDER — OXYCODONE-ACETAMINOPHEN 5-325 MG PO TABS
1.0000 | ORAL_TABLET | Freq: Once | ORAL | Status: AC
  Administered 2023-12-17: 1 via ORAL
  Filled 2023-12-17: qty 1

## 2023-12-17 NOTE — ED Triage Notes (Signed)
 C/O multiple abscesses to back of neck and head, currently taking antibiotics.  Arrives today c/o pain, and neck pain.  AAOx3.  Skin warm and dry. NAD

## 2023-12-17 NOTE — Discharge Instructions (Addendum)
 You were evaluated in the ED for a possible neck abscess.  We obtained a CT scan which did not show a fluid collection in the deep tissues of your neck.  But there is evidence of cellulitis.  We are continuing your oral antibiotics you are encouraged to take them as prescribed until the dosage is complete even if symptoms improve.  You have been given strict ED return precautions such as development of a fever or increased pain to the neck in which we will need to obtain further imaging to evaluate your symptoms.  We have also placed a referral for primary care.

## 2023-12-17 NOTE — ED Provider Notes (Signed)
 [facility] Emergency Department Provider Note     Event Date/Time   First MD Initiated Contact with Patient 12/17/23 1604     (approximate)   History   Neck Pain   HPI  Rodney Jones. is a 33 y.o. male with a history of asthma and HTN presents to the ED for evaluation of an abscess to his neck and scalp that has worsened in severity and pain.  Patient reports the pain is now descending to his right shoulder from his neck which is now causing difficulty with range of motion.  Patient denies fever or chills.  He denies difficulty swallowing.  Chart reviewed.  Patient was seen in this ED on 12/05/23 for same complaint.  Patient was placed on clindamycin antibiotics in which patient reports he has 2 more days left of this antibiotic but he returns to the ED for worsening pain.     Physical Exam   Triage Vital Signs: ED Triage Vitals  Encounter Vitals Group     BP 12/17/23 1446 (!) 157/119     Systolic BP Percentile --      Diastolic BP Percentile --      Pulse Rate 12/17/23 1446 96     Resp 12/17/23 1447 16     Temp 12/17/23 1446 98.4 F (36.9 C)     Temp Source 12/17/23 1446 Oral     SpO2 12/17/23 1446 100 %     Weight 12/17/23 1446 (!) 315 lb 4.1 oz (143 kg)     Height --      Head Circumference --      Peak Flow --      Pain Score 12/17/23 1445 10     Pain Loc --      Pain Education --      Exclude from Growth Chart --     Most recent vital signs: Vitals:   12/17/23 1446 12/17/23 1447  BP: (!) 157/119   Pulse: 96   Resp:  16  Temp: 98.4 F (36.9 C)   SpO2: 100%     General Awake, no distress.  HEENT NCAT. PERRL. EOMI. No rhinorrhea. Mucous membranes are moist.  CV:  Good peripheral perfusion.  RESP:  Normal effort.  ABD:  No distention.  Other:  Pain out of proportion to the right side and posterior neck.  No visible abscess or fluctuant mass noted on epidermal aspect.  Mild warmth and tightness of neck with palpation.  F  ROM is limited due to pain.   ED Results / Procedures / Treatments   Labs (all labs ordered are listed, but only abnormal results are displayed) Labs Reviewed  CBC WITH DIFFERENTIAL/PLATELET - Abnormal; Notable for the following components:      Result Value   WBC 17.5 (*)    Neutro Abs 12.3 (*)    Lymphs Abs 4.2 (*)    All other components within normal limits  BASIC METABOLIC PANEL - Abnormal; Notable for the following components:   Glucose, Bld 219 (*)    All other components within normal limits    RADIOLOGY  I personally viewed and evaluated these images as part of my medical decision making, as well as reviewing the written report by the radiologist.  ED Provider Interpretation: No obvious abscess noted will confirm with final radiology read.  CT Soft Tissue Neck W Contrast Result Date: 12/17/2023 CLINICAL DATA:  Initial evaluation for acute soft tissue infection. EXAM: CT NECK WITH CONTRAST  TECHNIQUE: Multidetector CT imaging of the neck was performed using the standard protocol following the bolus administration of intravenous contrast. RADIATION DOSE REDUCTION: This exam was performed according to the departmental dose-optimization program which includes automated exposure control, adjustment of the mA and/or kV according to patient size and/or use of iterative reconstruction technique. CONTRAST:  75mL OMNIPAQUE IOHEXOL 300 MG/ML  SOLN COMPARISON:  None Available. FINDINGS: Pharynx and larynx: Oral cavity within normal limits. No acute abnormality about the dentition. Oropharynx and nasopharynx within normal limits. No retropharyngeal collection or swelling. Negative epiglottis. Hypopharynx and supraglottic larynx within normal limits. Negative glottis. Subglottic airway clear. Salivary glands: Salivary glands including the parotid and submandibular glands are within normal limits. Thyroid: Normal. Lymph nodes: No enlarged or pathologic lymph nodes within the neck. Vascular: Normal  intravascular enhancement seen within the neck. Limited intracranial: Unremarkable. Visualized orbits: Unremarkable. Mastoids and visualized paranasal sinuses: Paranasal sinuses are clear. Mastoid air cells are well pneumatized and free of fluid. Skeleton: No discrete or worrisome osseous lesions. Upper chest: No other acute finding. Other: Small focus of localized soft tissue stranding seen within the subcutaneous fat of the right upper posterior neck (series 2, image 38). Finding is indeterminate by CT, and could reflect sequelae or possibly residual a of localized infection. Changes are largely confined to the subcutaneous fat. No loculated or drainable fluid collections. IMPRESSION: 1. Small focus of localized soft tissue density/stranding within the subcutaneous fat of the right upper posterior neck, indeterminate, and could reflect changes of localized infection or possibly residua of prior infection. No loculated or drainable fluid collections. 2. No other acute abnormality within the neck. Electronically Signed   By: Rise Mu M.D.   On: 12/17/2023 19:22    PROCEDURES:  Critical Care performed: No  Procedures   MEDICATIONS ORDERED IN ED: Medications  oxyCODONE-acetaminophen (PERCOCET/ROXICET) 5-325 MG per tablet 1 tablet (has no administration in time range)  morphine (PF) 4 MG/ML injection 4 mg (4 mg Intravenous Given 12/17/23 1743)  ondansetron (ZOFRAN) injection 4 mg (4 mg Intravenous Given 12/17/23 1743)  iohexol (OMNIPAQUE) 300 MG/ML solution 75 mL (75 mLs Intravenous Contrast Given 12/17/23 1823)     IMPRESSION / MDM / ASSESSMENT AND PLAN / ED COURSE  I reviewed the triage vital signs and the nursing notes.                                 33 y.o. male presents to the emergency department for evaluation and treatment of right-sided neck pain and worsening abscess. See HPI for further details.   Differential diagnosis includes, but is not limited to cellulitis,  abscess, retropharyngeal abscess, strain  Patient's presentation is most consistent with acute complicated illness / injury requiring diagnostic workup.  Physical exam findings are as stated above.  Lab work obtained and WBC trending up since last visit 14.2 ---> 17.5.  Plan will be to obtain a CT soft tissue neck with contrast to compare with imaging on 02/16 for severity of abscess.  ED pain management with morphine.  Shared visit with supervising physician, Dr. Arnoldo Morale.  Patient is requesting to leave due to his grandmother being in hospice and receiving a phone call stating that they do not expect her to make it through the night.  Patient is asked to stay and wait for the CT result.  He has agreed to this.  CT scan resulted showing soft tissue density stranding within the  subcutaneous fat of the upper posterior neck on the right side.  There is no loculated or drainable fluid collection.  Patient will be discharged home with doxycycline and Keflex.  He is instructed to complete his dosage of Bactrim from his prior ED visit in addition to these new antibiotics.  A PCP referral for follow-up has been ordered.  Patient verbalized understanding.  He is given strict ED return precautions for development of a fever and increase of pain for further imaging such as MRI for further assessment.  At this time patient is in stable condition for discharge home.  FINAL CLINICAL IMPRESSION(S) / ED DIAGNOSES   Final diagnoses:  Neck pain  Cellulitis of neck    Rx / DC Orders   ED Discharge Orders          Ordered    doxycycline (VIBRA-TABS) 100 MG tablet  2 times daily        12/17/23 1944    cephALEXin (KEFLEX) 500 MG capsule  4 times daily        12/17/23 1944    oxyCODONE-acetaminophen (PERCOCET) 7.5-325 MG tablet  Every 4 hours PRN        12/17/23 1944    Ambulatory Referral to Primary Care (Establish Care)        12/17/23 1944             Note:  This document was prepared using  Dragon voice recognition software and may include unintentional dictation errors.    Romeo Apple, Kasheem Toner A, PA-C 12/17/23 1952    Corena Herter, MD 12/17/23 2330

## 2023-12-17 NOTE — ED Provider Notes (Addendum)
 Shared visit  Presents to the emergency department with concern for upper neck and back pain.  Patient states that he was recently prescribed antibiotics and has almost completed the course has 2 days left.  History of abscess in his neck that had a recent I&D in the emergency department.  Denies fever or chills.  Tenderness to palpation on exam but no overlying erythema warmth induration.  Low suspicion for necrotizing soft tissue infection.  Plan for CT soft tissue neck CT scan with contrast to further evaluate for abscess or soft tissue infection.  Does have leukocytosis on lab work.  CT scan without an obvious abscess.  Mild soft tissue stranding.  Given his worsening leukocytosis we will switch from Bactrim to doxycycline and Keflex.  At this point do not feel that the patient needs admitted for IV antibiotics.  Denies any drug use, no fever, no history of cancer, no numbness or weakness of upper extremities.  I have a low suspicion for discitis or osteomyelitis.  Discussed that if he did not have any improvement with 2 days of antibiotics to return to the emergency department.  Discussed the need to follow-up with primary care provider.   Corena Herter, MD 12/17/23 [place] Faster    Corena Herter, MD 12/17/23 2000

## 2024-01-12 ENCOUNTER — Other Ambulatory Visit: Payer: Self-pay

## 2024-01-12 ENCOUNTER — Encounter: Payer: Self-pay | Admitting: Emergency Medicine

## 2024-01-12 ENCOUNTER — Emergency Department
Admission: EM | Admit: 2024-01-12 | Discharge: 2024-01-12 | Disposition: A | Discharge status: Home / Self Care | Payer: Self-pay | Attending: Emergency Medicine | Admitting: Emergency Medicine

## 2024-01-12 ENCOUNTER — Emergency Department: Payer: Self-pay

## 2024-01-12 DIAGNOSIS — L03221 Cellulitis of neck: Secondary | ICD-10-CM | POA: Insufficient documentation

## 2024-01-12 DIAGNOSIS — M542 Cervicalgia: Secondary | ICD-10-CM

## 2024-01-12 DIAGNOSIS — I10 Essential (primary) hypertension: Secondary | ICD-10-CM | POA: Insufficient documentation

## 2024-01-12 DIAGNOSIS — J45909 Unspecified asthma, uncomplicated: Secondary | ICD-10-CM | POA: Insufficient documentation

## 2024-01-12 LAB — CBC WITH DIFFERENTIAL/PLATELET
Abs Immature Granulocytes: 0.07 10*3/uL (ref 0.00–0.07)
Basophils Absolute: 0.1 10*3/uL (ref 0.0–0.1)
Basophils Relative: 0 %
Eosinophils Absolute: 0.1 10*3/uL (ref 0.0–0.5)
Eosinophils Relative: 1 %
HCT: 46.1 % (ref 39.0–52.0)
Hemoglobin: 14.9 g/dL (ref 13.0–17.0)
Immature Granulocytes: 0 %
Lymphocytes Relative: 23 %
Lymphs Abs: 3.5 10*3/uL (ref 0.7–4.0)
MCH: 28.2 pg (ref 26.0–34.0)
MCHC: 32.3 g/dL (ref 30.0–36.0)
MCV: 87.1 fL (ref 80.0–100.0)
Monocytes Absolute: 1 10*3/uL (ref 0.1–1.0)
Monocytes Relative: 7 %
Neutro Abs: 10.9 10*3/uL — ABNORMAL HIGH (ref 1.7–7.7)
Neutrophils Relative %: 69 %
Platelets: 272 10*3/uL (ref 150–400)
RBC: 5.29 MIL/uL (ref 4.22–5.81)
RDW: 12.5 % (ref 11.5–15.5)
WBC: 15.6 10*3/uL — ABNORMAL HIGH (ref 4.0–10.5)
nRBC: 0 % (ref 0.0–0.2)

## 2024-01-12 LAB — BASIC METABOLIC PANEL
Anion gap: 10 (ref 5–15)
BUN: 13 mg/dL (ref 6–20)
CO2: 23 mmol/L (ref 22–32)
Calcium: 9.3 mg/dL (ref 8.9–10.3)
Chloride: 102 mmol/L (ref 98–111)
Creatinine, Ser: 0.8 mg/dL (ref 0.61–1.24)
GFR, Estimated: >60 mL/min (ref >60)
Glucose, Bld: 234 mg/dL — ABNORMAL HIGH (ref 70–99)
Potassium: 3.9 mmol/L (ref 3.5–5.1)
Sodium: 135 mmol/L (ref 135–145)

## 2024-01-12 MED ORDER — ONDANSETRON HCL 4 MG/2ML IJ SOLN
4.0000 mg | Freq: Once | INTRAMUSCULAR | Status: AC
  Administered 2024-01-12: 4 mg via INTRAVENOUS
  Filled 2024-01-12: qty 2

## 2024-01-12 MED ORDER — DOXYCYCLINE HYCLATE 100 MG PO TABS
100.0000 mg | ORAL_TABLET | Freq: Two times a day (BID) | ORAL | 0 refills | Status: DC
Start: 2024-01-12 — End: 2024-03-14

## 2024-01-12 MED ORDER — MORPHINE SULFATE (PF) 4 MG/ML IV SOLN
4.0000 mg | Freq: Once | INTRAVENOUS | Status: AC
  Administered 2024-01-12: 4 mg via INTRAVENOUS
  Filled 2024-01-12: qty 1

## 2024-01-12 MED ORDER — OXYCODONE-ACETAMINOPHEN 5-325 MG PO TABS: 1.0000 | ORAL_TABLET | ORAL | 0 refills | Status: DC | PRN

## 2024-01-12 MED ORDER — IOHEXOL 300 MG/ML  SOLN
100.0000 mL | Freq: Once | INTRAMUSCULAR | Status: AC | PRN
  Administered 2024-01-12: 100 mL via INTRAVENOUS

## 2024-01-12 MED ORDER — CEPHALEXIN 500 MG PO CAPS
500.0000 mg | ORAL_CAPSULE | Freq: Four times a day (QID) | ORAL | 0 refills | Status: AC
Start: 2024-01-12 — End: 2024-01-19

## 2024-01-12 NOTE — ED Provider Notes (Signed)
 [facility] Provider Note    Event Date/Time   First MD Initiated Contact with Patient 01/12/24 [id number]     (approximate)   History   Chief Complaint Torticollis   HPI  Rodney Swor. is a 33 y.o. male with past medical history of hypertension and asthma who presents to the ED complaining of neck pain.  Patient reports that he has been dealing with increasing pain in the right side of his neck for about the past week which seems to shoot down his right arm.  He describes it as a sharp pain that is worse with any movement of his neck.  He has not had any trauma to his head or neck, has dealt with recurrent infection and had an abscess drained near the base of his head about a month and a half ago.  He was prescribed an additional course of antibiotics after being seen in the ED a second time about a month ago, but states that he was not able to pick up the antibiotics because that he was told they were not available when he went to the pharmacy.  He denies any fevers, has not had any numbness or weakness in his extremities, does feel like his neck is slightly swollen on the right side.  He does state that he has been taking his blood pressure medication as prescribed, including a dose earlier this morning.     Physical Exam   Triage Vital Signs: ED Triage Vitals [01/12/24 0426]  Encounter Vitals Group     BP (!) 174/119     Systolic BP Percentile      Diastolic BP Percentile      Pulse Rate (!) 103     Resp 18     Temp 97.7 F (36.5 C)     Temp Source Oral     SpO2 95 %     Weight (!) 315 lb (142.9 kg)     Height 5\' 8"  (1.727 m)     Head Circumference      Peak Flow      Pain Score 10     Pain Loc      Pain Education      Exclude from Growth Chart     Most recent vital signs: Vitals:   01/12/24 0426 01/12/24 0843  BP: (!) 174/119 (!) 174/104  Pulse: (!) 103 97  Resp: 18 18  Temp: 97.7 F (36.5 C)   SpO2: 95% 100%    Constitutional: Alert  and oriented. Eyes: Conjunctivae are normal. Head: Atraumatic. Nose: No congestion/rhinnorhea. Mouth/Throat: Mucous membranes are moist.  Neck: Midline and right-sided neck tenderness with associated edema, no erythema or fluctuance noted. Cardiovascular: Normal rate, regular rhythm. Grossly normal heart sounds.  2+ radial pulses bilaterally. Respiratory: Normal respiratory effort.  No retractions. Lungs CTAB. Gastrointestinal: Soft and nontender. No distention. Musculoskeletal: No lower extremity tenderness nor edema.  Neurologic:  Normal speech and language. No gross focal neurologic deficits are appreciated.    ED Results / Procedures / Treatments   Labs (all labs ordered are listed, but only abnormal results are displayed) Labs Reviewed  CBC WITH DIFFERENTIAL/PLATELET - Abnormal; Notable for the following components:      Result Value   WBC 15.6 (*)    Neutro Abs 10.9 (*)    All other components within normal limits  BASIC METABOLIC PANEL - Abnormal; Notable for the following components:   Glucose, Bld 234 (*)    All  other components within normal limits     RADIOLOGY CT soft tissue neck reviewed and interpreted by me with soft tissue stranding in the right neck, no focal fluid collection noted.  PROCEDURES:  Critical Care performed: No  Procedures   MEDICATIONS ORDERED IN ED: Medications  morphine (PF) 4 MG/ML injection 4 mg (4 mg Intravenous Given 01/12/24 0749)  ondansetron (ZOFRAN) injection 4 mg (4 mg Intravenous Given 01/12/24 0749)  iohexol (OMNIPAQUE) 300 MG/ML solution 100 mL (100 mLs Intravenous Contrast Given 01/12/24 0831)     IMPRESSION / MDM / ASSESSMENT AND PLAN / ED COURSE  I reviewed the triage vital signs and the nursing notes.                              33 y.o. male with past medical history of hypertension and asthma who presents to the ED complaining of increasing pain and swelling to the right side of his neck after dealing with recurrent  infection to this area last month.  Patient's presentation is most consistent with acute presentation with potential threat to life or bodily function.  Differential diagnosis includes, but is not limited to, abscess, cellulitis, radiculopathy, cervical strain.  Patient nontoxic-appearing and in no acute distress, vital signs remarkable for hypertension but otherwise reassuring.  Patient reports compliance with his antihypertensive medication, no focal deficits to suggest stroke or other findings concerning for hypertensive emergency.  He does have edema and tenderness to the right side of his neck, has dealt with infection to this area in the past and we will repeat CT imaging of his neck.  He does not have any obvious drainable fluid collection on exam.  Labs are pending at this time, will treat with IV morphine and Zofran.  CT imaging similar to that of a month ago with soft tissue swelling in the right neck and no associated fluid collection.  Labs show leukocytosis without significant anemia, electrolyte abnormality, or AKI.  Given ongoing leukocytosis with swelling, suspect infectious process and we will represcribe antibiotics that he was unable to pick up last month.  He was also prescribed a small amount of pain medication, counseled to follow-up with PCP for this as well as recheck of his blood pressure.  He was counseled to return to the ED for new or worsening symptoms, patient agrees with plan.    FINAL CLINICAL IMPRESSION(S) / ED DIAGNOSES   Final diagnoses:  Neck pain on right side  Cellulitis, neck  Uncontrolled hypertension     Rx / DC Orders   ED Discharge Orders          Ordered    doxycycline (VIBRA-TABS) 100 MG tablet  2 times daily        01/12/24 0922    cephALEXin (KEFLEX) 500 MG capsule  4 times daily        01/12/24 0922    oxyCODONE-acetaminophen (PERCOCET) 5-325 MG tablet  Every 4 hours PRN        01/12/24 0922    Ambulatory Referral to Primary Care  (Establish Care)        01/12/24 5621             Note:  This document was prepared using Dragon voice recognition software and may include unintentional dictation errors.   Chesley Noon, MD 01/12/24 [phone]

## 2024-01-12 NOTE — ED Triage Notes (Signed)
 Patient ambulatory to triage with steady gait, without difficulty or distress noted; pt reports being seen recently for neck pain and abscess to scalp; st the abscess has since resolved despite not having his rx called in to pharmacy but cont to having pain to rt side neck/shoulder with movement and palpation

## 2024-01-12 NOTE — ED Notes (Signed)
 Pt taken to ct

## 2024-02-03 ENCOUNTER — Emergency Department: Payer: Self-pay

## 2024-02-03 ENCOUNTER — Emergency Department
Admission: EM | Admit: 2024-02-03 | Discharge: 2024-02-03 | Disposition: A | Discharge status: Home / Self Care | Payer: Self-pay | Attending: Emergency Medicine | Admitting: Emergency Medicine

## 2024-02-03 ENCOUNTER — Encounter: Payer: Self-pay | Admitting: Emergency Medicine

## 2024-02-03 ENCOUNTER — Other Ambulatory Visit: Payer: Self-pay

## 2024-02-03 DIAGNOSIS — D72829 Elevated white blood cell count, unspecified: Secondary | ICD-10-CM | POA: Insufficient documentation

## 2024-02-03 DIAGNOSIS — M542 Cervicalgia: Secondary | ICD-10-CM | POA: Insufficient documentation

## 2024-02-03 DIAGNOSIS — R739 Hyperglycemia, unspecified: Secondary | ICD-10-CM

## 2024-02-03 LAB — CBC WITH DIFFERENTIAL/PLATELET
Abs Immature Granulocytes: 0.09 10*3/uL — ABNORMAL HIGH (ref 0.00–0.07)
Basophils Absolute: 0.1 10*3/uL (ref 0.0–0.1)
Basophils Relative: 0 %
Eosinophils Absolute: 0.1 10*3/uL (ref 0.0–0.5)
Eosinophils Relative: 1 %
HCT: 47 % (ref 39.0–52.0)
Hemoglobin: 15.4 g/dL (ref 13.0–17.0)
Immature Granulocytes: 1 %
Lymphocytes Relative: 20 %
Lymphs Abs: 2.7 10*3/uL (ref 0.7–4.0)
MCH: 28.8 pg (ref 26.0–34.0)
MCHC: 32.8 g/dL (ref 30.0–36.0)
MCV: 88 fL (ref 80.0–100.0)
Monocytes Absolute: 0.8 10*3/uL (ref 0.1–1.0)
Monocytes Relative: 5 %
Neutro Abs: 10.1 10*3/uL — ABNORMAL HIGH (ref 1.7–7.7)
Neutrophils Relative %: 73 %
Platelets: 250 10*3/uL (ref 150–400)
RBC: 5.34 MIL/uL (ref 4.22–5.81)
RDW: 12 % (ref 11.5–15.5)
WBC: 13.8 10*3/uL — ABNORMAL HIGH (ref 4.0–10.5)
nRBC: 0 % (ref 0.0–0.2)

## 2024-02-03 LAB — COMPREHENSIVE METABOLIC PANEL WITH GFR
ALT: 41 U/L (ref 0–44)
AST: 40 U/L (ref 15–41)
Albumin: 4.3 g/dL (ref 3.5–5.0)
Alkaline Phosphatase: 111 U/L (ref 38–126)
Anion gap: 7 (ref 5–15)
BUN: 13 mg/dL (ref 6–20)
CO2: 25 mmol/L (ref 22–32)
Calcium: 9.2 mg/dL (ref 8.9–10.3)
Chloride: 98 mmol/L (ref 98–111)
Creatinine, Ser: 1.01 mg/dL (ref 0.61–1.24)
GFR, Estimated: >60 mL/min (ref >60)
Glucose, Bld: 345 mg/dL — ABNORMAL HIGH (ref 70–99)
Potassium: 4.7 mmol/L (ref 3.5–5.1)
Sodium: 130 mmol/L — ABNORMAL LOW (ref 135–145)
Total Bilirubin: 0.8 mg/dL (ref 0.0–1.2)
Total Protein: 7.7 g/dL (ref 6.5–8.1)

## 2024-02-03 MED ORDER — METFORMIN HCL 500 MG PO TABS
500.0000 mg | ORAL_TABLET | Freq: Two times a day (BID) | ORAL | 2 refills | Status: AC
Start: 2024-02-03 — End: 2024-05-03

## 2024-02-03 MED ORDER — SODIUM CHLORIDE 0.9 % IV BOLUS
1000.0000 mL | Freq: Once | INTRAVENOUS | Status: AC
  Administered 2024-02-03: 1000 mL via INTRAVENOUS

## 2024-02-03 MED ORDER — OXYCODONE-ACETAMINOPHEN 5-325 MG PO TABS
1.0000 | ORAL_TABLET | Freq: Once | ORAL | Status: AC
  Administered 2024-02-03: 1 via ORAL
  Filled 2024-02-03: qty 1

## 2024-02-03 MED ORDER — KETOROLAC TROMETHAMINE 15 MG/ML IJ SOLN
15.0000 mg | Freq: Once | INTRAMUSCULAR | Status: AC
  Administered 2024-02-03: 15 mg via INTRAVENOUS
  Filled 2024-02-03: qty 1

## 2024-02-03 MED ORDER — CYCLOBENZAPRINE HCL 10 MG PO TABS: 10.0000 mg | ORAL_TABLET | Freq: Three times a day (TID) | ORAL | 0 refills | Status: DC | PRN

## 2024-02-03 MED ORDER — OXYCODONE-ACETAMINOPHEN 5-325 MG PO TABS: 1.0000 | ORAL_TABLET | ORAL | 0 refills | Status: AC | PRN

## 2024-02-03 MED ORDER — IOHEXOL 300 MG/ML  SOLN
75.0000 mL | Freq: Once | INTRAMUSCULAR | Status: AC | PRN
  Administered 2024-02-03: 75 mL via INTRAVENOUS

## 2024-02-03 MED ORDER — DIAZEPAM 5 MG/ML IJ SOLN
10.0000 mg | Freq: Once | INTRAMUSCULAR | Status: AC
  Administered 2024-02-03: 10 mg via INTRAVENOUS
  Filled 2024-02-03: qty 2

## 2024-02-03 NOTE — ED Triage Notes (Signed)
 Patient ambulatory to triage with steady gait, without difficulty or distress noted; pt reports that he tripped and fell last night; c/o upper back and rt sided neck pain; denies LOC or hitting head; st he believes he "aggravated his neck pain" for which he has been seen for recently

## 2024-02-03 NOTE — ED Provider Notes (Signed)
 [facility] Provider Note    Event Date/Time   First MD Initiated Contact with Patient 02/03/24 [phone]     (approximate)   History   Fall   HPI  Rodney Elwood. is a  33 year old male with history of recurrent neck abscesses presenting to the ER for evaluation of neck pain.  Patient has had multiple recent visits to our ER over the past 2 months related to recurrent neck pain secondary to cellulitis and a neck abscess.  During his most recent visit, he was discharged on antibiotics.  Reports he had significant improvement following this.  However, few days ago he began to notice worsening pain over his midline neck into his right side.  Yesterday, he tripped coming out of his camper and fell onto his right side which is significantly worsened his pain.  No numbness, tingling, focal weakness.  No fevers or chills.  Denies injury to other areas.  Multiple prior ER visits and CT scans.  On 11/24/2014 was noted to have a area concerning for abscess for which I&D was performed.  Subsequent CT scans with localized soft tissue thickening without discrete abscess.  Most recently seen on 3/26 at which time patient was discharged on Keflex and doxycycline.      Physical Exam   Triage Vital Signs: ED Triage Vitals  Encounter Vitals Group     BP 02/03/24 0648 (!) 165/103     Systolic BP Percentile --      Diastolic BP Percentile --      Pulse Rate 02/03/24 0648 (!) 117     Resp 02/03/24 0648 20     Temp 02/03/24 0648 97.9 F (36.6 C)     Temp Source 02/03/24 0648 Oral     SpO2 02/03/24 0648 96 %     Weight 02/03/24 0647 (!) 315 lb 4.1 oz (143 kg)     Height 02/03/24 0647 5\' 8"  (1.727 m)     Head Circumference --      Peak Flow --      Pain Score 02/03/24 0645 10     Pain Loc --      Pain Education --      Exclude from Growth Chart --     Most recent vital signs: Vitals:   02/03/24 0648  BP: (!) 165/103  Pulse: (!) 117  Resp: 20  Temp: 97.9 F (36.6 C)   SpO2: 96%     Nursing notes and vital signs reviewed.  General: Adult male, sitting upright in bed, awake, appears uncomfortable Head: Atraumatic Neck: Tenderness throughout the posterior neck including over the midline extending into the right paraspinous musculature.  No appreciable overlying skin changes.  Chest: Symmetric chest rise, no tenderness to palpation.  Cardiac: Tachycardic Respiratory: Lungs clear to auscultation Abdomen: Soft, nondistended. No tenderness to palpation.  MSK: No deformity to bilateral upper and lower extremity. Full range of motion to bilateral upper lower extremity.  2+ radial pulses bilaterally. Neuro: Alert, oriented. GCS 15. 5 out of 5 strength in bilateral upper and lower extremities. Normal sensation to light touch in bilateral upper and lower extremity. Skin: No evidence of burns or lacerations.   ED Results / Procedures / Treatments   Labs (all labs ordered are listed, but only abnormal results are displayed) Labs Reviewed  CBC WITH DIFFERENTIAL/PLATELET - Abnormal; Notable for the following components:      Result Value   WBC 13.8 (*)    Neutro Abs 10.1 (*)  Abs Immature Granulocytes 0.09 (*)    All other components within normal limits  COMPREHENSIVE METABOLIC PANEL WITH GFR - Abnormal; Notable for the following components:   Sodium 130 (*)    Glucose, Bld 345 (*)    All other components within normal limits     EKG EKG independently reviewed interpreted by myself (ER attending) demonstrates:    RADIOLOGY Imaging independently reviewed and interpreted by myself demonstrates:  CT soft tissue neck without recurrent abscess or acute bony abnormality  Formal Radiology Read:  CT Soft Tissue Neck W Contrast Result Date: 02/03/2024 CLINICAL DATA:  33 year old male with recurrent neck pain. Upper back and right side neck pain. Subcutaneous posterior neck abscess earlier this year. EXAM: CT NECK WITH CONTRAST TECHNIQUE: Multidetector CT  imaging of the neck was performed using the standard protocol following the bolus administration of intravenous contrast. RADIATION DOSE REDUCTION: This exam was performed according to the departmental dose-optimization program which includes automated exposure control, adjustment of the mA and/or kV according to patient size and/or use of iterative reconstruction technique. CONTRAST:  75mL OMNIPAQUE IOHEXOL 300 MG/ML  SOLN COMPARISON:  Neck CTs 01/12/2024 and earlier. FINDINGS: Pharynx and larynx: Glottis is closed. Laryngeal soft tissue contours remain normal. Stable palatine tonsil enlargement which is symmetric. No tonsillar hyperenhancement. Other pharyngeal soft tissue contours within normal limits. Parapharyngeal and retropharyngeal spaces appear negative. Salivary glands: Negative.  Negative sublingual space. Thyroid: Negative. Lymph nodes: Bilateral cervical lymph nodes are stable since February, with up to 10 mm level 2 lymph nodes (upper limits of normal), no heterogeneous or suspicious cervical nodes. Vascular: Suboptimal intravascular contrast bolus but the major vascular structures in the bilateral neck and at the skull base appear grossly patent. Limited intracranial: Negative. Visualized orbits: Negative. Mastoids and visualized paranasal sinuses: Clear. Skeleton: Carious maxillary wisdom teeth redemonstrated. No acute or suspicious osseous lesion. Upper chest: Negative. Other: Stable residual induration and scarring at the site of the midline suboccipital dermis associated fluid collection in February (series 12, image 67 now). This overlies the occipital protuberance. No new or increased soft tissue inflammation. IMPRESSION: 1. Stable from last month residual scarring, induration at the site of the midline suboccipital fluid collection in February. 2. No new abnormality in the Neck. 3. Carious maxillary wisdom teeth. Electronically Signed   By: Odessa Fleming M.D.   On: 02/03/2024 08:45     PROCEDURES:  Critical Care performed: No  Procedures   MEDICATIONS ORDERED IN ED: Medications  oxyCODONE-acetaminophen (PERCOCET/ROXICET) 5-325 MG per tablet 1 tablet (has no administration in time range)  ketorolac (TORADOL) 15 MG/ML injection 15 mg (15 mg Intravenous Given 02/03/24 0745)  diazepam (VALIUM) injection 10 mg (10 mg Intravenous Given 02/03/24 0746)  sodium chloride 0.9 % bolus 1,000 mL (1,000 mLs Intravenous New Bag/Given 02/03/24 0820)  iohexol (OMNIPAQUE) 300 MG/ML solution 75 mL (75 mLs Intravenous Contrast Given 02/03/24 0805)     IMPRESSION / MDM / ASSESSMENT AND PLAN / ED COURSE  I reviewed the triage vital signs and the nursing notes.  Differential diagnosis includes, but is not limited to, cervical spine fracture, recurrent cellulitis, abscess, musculoskeletal strain, no indication for head imaging, no evidence of thoracoabdominal trauma  Patient's presentation is most consistent with acute presentation with potential threat to life or bodily function.  33 year old male presenting with neck pain.  History of recurrent cellulitis and neck abscesses, also with recent trauma.  Tachycardic on presentation.  Will obtain labs, CT to further evaluate.  Will trial multimodal pain  control.  Clinical Course as of 02/03/24 0857  Thu Feb 03, 2024  0854 CT with stable findings, no acute fracture, abscess.  Blood work notable for hyperglycemia with glucose of 345.  Persistent but improved leukocytosis.  Patient reassessed, some improvement in pain but some ongoing pain.  Will give single dose of pain medication here.  Discussed results of workup.  Suspect likely primary musculoskeletal strain.  No clinical signs of cellulitis on exam, do not think there is an indication for antibiotics currently.  Discussed multimodal pain control including Tylenol, NSAIDs.  Will also DC with muscle relaxer.  Discussed likely untreated diabetes with hyperglycemia here, though fortunately  without evidence of DKA.  Will start on metformin, but did discuss importance of very close follow-up with primary care doctor.  HR improved to low 100s. Strict return precautions provided.  Patient discharged in stable condition. [NR]    Clinical Course User Index [NR] Claria Crofts, MD     FINAL CLINICAL IMPRESSION(S) / ED DIAGNOSES   Final diagnoses:  None     Rx / DC Orders   ED Discharge Orders     None        Note:  This document was prepared using Dragon voice recognition software and may include unintentional dictation errors.   Claria Crofts, MD 02/03/24 0900

## 2024-02-03 NOTE — ED Notes (Signed)
 Pt stated that he tripped and fell last night and has aggravated his abscess located in his right shoulder. Pain in the right shoulder when it is touched and moving.

## 2024-02-03 NOTE — Discharge Instructions (Addendum)
 You were seen in the ER for your neck pain.  I suspect you likely have a strain of your neck.  You can continue take Tylenol to help with your pain.  Have also sent a course of a muscle relaxer as well as a narcotic pain medication for breakthrough pain.  These both can make you drowsy, do not drive or operate machinery when taking these and do not take them together.  I also suspect that you have diabetes.  I have sent a prescription for metformin to your pharmacy.  Please arrange close follow-up with a primary care doctor for further evaluation.  Return to the ER for new or worsening symptoms.

## 2024-03-14 ENCOUNTER — Other Ambulatory Visit: Payer: Self-pay

## 2024-03-14 ENCOUNTER — Encounter: Payer: Self-pay | Admitting: Emergency Medicine

## 2024-03-14 ENCOUNTER — Emergency Department
Admission: EM | Admit: 2024-03-14 | Discharge: 2024-03-14 | Disposition: A | Discharge status: Home / Self Care | Payer: Self-pay | Attending: Emergency Medicine | Admitting: Emergency Medicine

## 2024-03-14 DIAGNOSIS — I10 Essential (primary) hypertension: Secondary | ICD-10-CM | POA: Insufficient documentation

## 2024-03-14 DIAGNOSIS — M542 Cervicalgia: Secondary | ICD-10-CM | POA: Insufficient documentation

## 2024-03-14 DIAGNOSIS — J45909 Unspecified asthma, uncomplicated: Secondary | ICD-10-CM | POA: Insufficient documentation

## 2024-03-14 MED ORDER — DOXYCYCLINE HYCLATE 100 MG PO TABS: 100.0000 mg | ORAL_TABLET | Freq: Two times a day (BID) | ORAL | 0 refills | Status: AC

## 2024-03-14 MED ORDER — DICLOFENAC SODIUM 50 MG PO TBEC: 50.0000 mg | DELAYED_RELEASE_TABLET | Freq: Two times a day (BID) | ORAL | 0 refills | Status: AC

## 2024-03-14 MED ORDER — IBUPROFEN 600 MG PO TABS: 600.0000 mg | ORAL_TABLET | Freq: Once | ORAL | Status: DC

## 2024-03-14 NOTE — ED Provider Notes (Signed)
 [facility] Provider Note   None    (approximate) History  Fall  HPI Rodney Jones. is a 33 y.o. male with a past medical history of asthma, hypertension, and recurrent neck abscesses who presents complaining of right neck pain with a sensation of fluctuance underneath the skin on the right side.  Patient states that this feels similar to abscesses that he has had in the past.  Patient denies any break in the skin prior to this fluctuance.  Patient denies any fevers ROS: Patient currently denies any vision changes, tinnitus, difficulty speaking, facial droop, sore throat, chest pain, shortness of breath, abdominal pain, nausea/vomiting/diarrhea, dysuria, or weakness/numbness/paresthesias in any extremity   Physical Exam  Triage Vital Signs: ED Triage Vitals [03/14/24 0449]  Encounter Vitals Group     BP      Systolic BP Percentile      Diastolic BP Percentile      Pulse      Resp      Temp      Temp src      SpO2      Weight (!) 320 lb (145.2 kg)     Height 5\' 8"  (1.727 m)     Head Circumference      Peak Flow      Pain Score 10     Pain Loc      Pain Education      Exclude from Growth Chart    Most recent vital signs: Vitals:   03/14/24 0454  BP: (!) 168/116  Pulse: 100  Resp: 20  Temp: 97.6 F (36.4 C)  SpO2: 99%   General: Awake, oriented x4. CV:  Good peripheral perfusion.  Resp:  Normal effort.  Abd:  No distention.  Other:  Middle-aged morbidly obese African-American male resting comfortably in no acute distress.  There is tenderness to palpation of the right inferior neck area.  Under bedside ultrasound there is a small 0.5 x 0.5 x 0.5 fluid collection approximately 1 cm into the neck. ED Results / Procedures / Treatments  Labs (all labs ordered are listed, but only abnormal results are displayed) Labs Reviewed - No data to display PROCEDURES: Critical Care performed: No Procedures MEDICATIONS ORDERED IN ED: Medications - No  data to display IMPRESSION / MDM / ASSESSMENT AND PLAN / ED COURSE  I reviewed the triage vital signs and the nursing notes.                             The patient is on the cardiac monitor to evaluate for evidence of arrhythmia and/or significant heart rate changes. Patient's presentation is most consistent with acute presentation with potential threat to life or bodily function. + neck pain  ED Workup: Defer C-Spine imaging given negative by NEXUS criteria  Given History, Exam the patient appears to have a cervical radiculopathy.   Patient appears to be low risk for complications or other emergent conditions such as  anginal equivalent, frank cervical instability, arterial dissection, osteomyelitis, epidural abscess, central cord syndrome, c-spine fracture, other spinal emergencies Bedside ultrasound shows small fluid collection approximately 1 cm deep to the skin.  This is too deep and next to large vascular structures to perform an incision and drainage at this time.  Patient has no outward signs of erythema or surrounding induration.  It is possible that this may be a hematoma Rx: NSAIDs, wait-and-see antibiotic prescription Disposition: Discharge. The  patient has been given strict return precautions and understands the need to follow up within 48 hours with their primary care provider   FINAL CLINICAL IMPRESSION(S) / ED DIAGNOSES   Final diagnoses:  Neck pain on right side   Rx / DC Orders   ED Discharge Orders          Ordered    doxycycline  (VIBRA -TABS) 100 MG tablet  2 times daily        03/14/24 0510           Note:  This document was prepared using Dragon voice recognition software and may include unintentional dictation errors.   Nashika Coker K, MD 03/14/24 [phone]

## 2024-03-14 NOTE — ED Triage Notes (Addendum)
 Patient ambulatory to triage with steady gait, without difficulty or distress noted; pt reports hx of being seen for having abscess to scalp and neck; st last night he was "stepping over his girlfriend and tripped and fell and it triggered it"

## 2024-03-19 ENCOUNTER — Emergency Department
Admission: EM | Admit: 2024-03-19 | Discharge: 2024-03-19 | Disposition: A | Discharge status: Home / Self Care | Payer: Self-pay | Attending: Emergency Medicine | Admitting: Emergency Medicine

## 2024-03-19 ENCOUNTER — Emergency Department: Payer: Self-pay

## 2024-03-19 DIAGNOSIS — J45909 Unspecified asthma, uncomplicated: Secondary | ICD-10-CM | POA: Insufficient documentation

## 2024-03-19 DIAGNOSIS — E1165 Type 2 diabetes mellitus with hyperglycemia: Secondary | ICD-10-CM | POA: Insufficient documentation

## 2024-03-19 DIAGNOSIS — M542 Cervicalgia: Secondary | ICD-10-CM | POA: Insufficient documentation

## 2024-03-19 DIAGNOSIS — I10 Essential (primary) hypertension: Secondary | ICD-10-CM | POA: Insufficient documentation

## 2024-03-19 HISTORY — DX: Type 2 diabetes mellitus without complications: E11.9

## 2024-03-19 LAB — RAPID HIV SCREEN (HIV 1/2 AB+AG)
HIV 1/2 Antibodies: NONREACTIVE
HIV-1 P24 Antigen - HIV24: NONREACTIVE

## 2024-03-19 LAB — CBG MONITORING, ED: Glucose-Capillary: 346 mg/dL — ABNORMAL HIGH (ref 70–99)

## 2024-03-19 LAB — COMPREHENSIVE METABOLIC PANEL WITH GFR
ALT: 38 U/L (ref 0–44)
AST: 32 U/L (ref 15–41)
Albumin: 4.2 g/dL (ref 3.5–5.0)
Alkaline Phosphatase: 121 U/L (ref 38–126)
Anion gap: 7 (ref 5–15)
BUN: 12 mg/dL (ref 6–20)
CO2: 26 mmol/L (ref 22–32)
Calcium: 9.4 mg/dL (ref 8.9–10.3)
Chloride: 95 mmol/L — ABNORMAL LOW (ref 98–111)
Creatinine, Ser: 0.76 mg/dL (ref 0.61–1.24)
GFR, Estimated: >60 mL/min (ref >60)
Glucose, Bld: 409 mg/dL — ABNORMAL HIGH (ref 70–99)
Potassium: 4.1 mmol/L (ref 3.5–5.1)
Sodium: 128 mmol/L — ABNORMAL LOW (ref 135–145)
Total Bilirubin: 0.6 mg/dL (ref 0.0–1.2)
Total Protein: 7.6 g/dL (ref 6.5–8.1)

## 2024-03-19 LAB — CBC WITH DIFFERENTIAL/PLATELET
Abs Immature Granulocytes: 0.03 10*3/uL (ref 0.00–0.07)
Basophils Absolute: 0 10*3/uL (ref 0.0–0.1)
Basophils Relative: 0 %
Eosinophils Absolute: 0.2 10*3/uL (ref 0.0–0.5)
Eosinophils Relative: 2 %
HCT: 48.9 % (ref 39.0–52.0)
Hemoglobin: 16.3 g/dL (ref 13.0–17.0)
Immature Granulocytes: 0 %
Lymphocytes Relative: 30 %
Lymphs Abs: 3.7 10*3/uL (ref 0.7–4.0)
MCH: 28.5 pg (ref 26.0–34.0)
MCHC: 33.3 g/dL (ref 30.0–36.0)
MCV: 85.6 fL (ref 80.0–100.0)
Monocytes Absolute: 0.8 10*3/uL (ref 0.1–1.0)
Monocytes Relative: 6 %
Neutro Abs: 7.5 10*3/uL (ref 1.7–7.7)
Neutrophils Relative %: 62 %
Platelets: 221 10*3/uL (ref 150–400)
RBC: 5.71 MIL/uL (ref 4.22–5.81)
RDW: 11.8 % (ref 11.5–15.5)
WBC: 12.2 10*3/uL — ABNORMAL HIGH (ref 4.0–10.5)
nRBC: 0 % (ref 0.0–0.2)

## 2024-03-19 LAB — MONONUCLEOSIS SCREEN: Mono Screen: NEGATIVE

## 2024-03-19 MED ORDER — DEXAMETHASONE SODIUM PHOSPHATE 10 MG/ML IJ SOLN: 10.0000 mg | Freq: Once | INTRAMUSCULAR | Status: DC

## 2024-03-19 MED ORDER — SODIUM CHLORIDE 0.9 % IV BOLUS
1000.0000 mL | Freq: Once | INTRAVENOUS | Status: AC
  Administered 2024-03-19: 1000 mL via INTRAVENOUS

## 2024-03-19 MED ORDER — ONDANSETRON HCL 4 MG/2ML IJ SOLN
4.0000 mg | Freq: Once | INTRAMUSCULAR | Status: AC
  Administered 2024-03-19: 4 mg via INTRAVENOUS
  Filled 2024-03-19: qty 2

## 2024-03-19 MED ORDER — FENTANYL CITRATE PF 50 MCG/ML IJ SOSY
100.0000 ug | PREFILLED_SYRINGE | Freq: Once | INTRAMUSCULAR | Status: AC
  Administered 2024-03-19: 100 ug via INTRAVENOUS
  Filled 2024-03-19: qty 2

## 2024-03-19 MED ORDER — TRAMADOL HCL 50 MG PO TABS: 50.0000 mg | ORAL_TABLET | Freq: Four times a day (QID) | ORAL | 0 refills | Status: DC | PRN

## 2024-03-19 MED ORDER — KETOROLAC TROMETHAMINE 30 MG/ML IJ SOLN
30.0000 mg | Freq: Once | INTRAMUSCULAR | Status: AC
Start: 2024-03-19 — End: 2024-03-19
  Administered 2024-03-19: 30 mg via INTRAVENOUS
  Filled 2024-03-19: qty 1

## 2024-03-19 MED ORDER — GADOBUTROL 1 MMOL/ML IV SOLN
10.0000 mL | Freq: Once | INTRAVENOUS | Status: AC | PRN
  Administered 2024-03-19: 10 mL via INTRAVENOUS

## 2024-03-19 MED ORDER — CYCLOBENZAPRINE HCL 10 MG PO TABS: 10.0000 mg | ORAL_TABLET | Freq: Three times a day (TID) | ORAL | 0 refills | Status: DC | PRN

## 2024-03-19 NOTE — ED Provider Notes (Signed)
 [facility] Provider Note    Event Date/Time   First MD Initiated Contact with Patient 03/19/24 [phone]     (approximate)   History   Torticollis   HPI  Rodney Jones. is a 33 y.o. male with history of asthma, hypertension, diabetes and as listed in EMR presents to the emergency department for treatment and evaluation of right side neck pain that has been ongoing since February.  Pain will decrease but not completely resolve.  He has had multiple tests and has been placed on antibiotics, pain medicine, and muscle relaxers.  Some of these treatments have helped but have not made the pain completely go away.  Feels that the pain is now on the left side as well as the right.  He has had fluid drained from an abscess on the right side in the past.  He currently denies fever and has not noticed any redness over the skin or pustules/abscess.  Pain is on the sides of his neck.  He denies fever.  He denies history of or current drug abuse.  He denies dysphagia or sensation of airway involvement.  He denies sore throat or headache.      Physical Exam   Triage Vital Signs: ED Triage Vitals  Encounter Vitals Group     BP 03/19/24 0621 (!) 166/120     Systolic BP Percentile --      Diastolic BP Percentile --      Pulse Rate 03/19/24 0621 (!) 108     Resp 03/19/24 0621 20     Temp 03/19/24 0621 98.4 F (36.9 C)     Temp Source 03/19/24 0621 Oral     SpO2 03/19/24 0621 96 %     Weight 03/19/24 0619 (!) 320 lb (145.2 kg)     Height --      Head Circumference --      Peak Flow --      Pain Score 03/19/24 0619 10     Pain Loc --      Pain Education --      Exclude from Growth Chart --     Most recent vital signs: Vitals:   03/19/24 1113 03/19/24 1302  BP: (!) 148/104 (!) 166/106  Pulse: 85 (!) 102  Resp: 20 17  Temp:    SpO2: 97% 99%    General: Awake, no distress.  CV:  Good peripheral perfusion.  Resp:  Normal effort.  Abd:  No distention.   Other:  Tender, palpable, non pulsatile mass at base of right and left lateral neck without associated erythema, induration or fluctuance. Areas feel like enlarged lymph nodes/cystic lesions   ED Results / Procedures / Treatments   Labs (all labs ordered are listed, but only abnormal results are displayed) Labs Reviewed  COMPREHENSIVE METABOLIC PANEL WITH GFR - Abnormal; Notable for the following components:      Result Value   Sodium 128 (*)    Chloride 95 (*)    Glucose, Bld 409 (*)    All other components within normal limits  CBC WITH DIFFERENTIAL/PLATELET - Abnormal; Notable for the following components:   WBC 12.2 (*)    All other components within normal limits  CBG MONITORING, ED - Abnormal; Notable for the following components:   Glucose-Capillary 346 (*)    All other components within normal limits  RAPID HIV SCREEN (HIV 1/2 AB+AG)  MONONUCLEOSIS SCREEN  RPR     EKG  Not indicated  RADIOLOGY  Image and radiology report reviewed and interpreted by me. Radiology report consistent with the same.  Small 1.5 cm area of suboccipital induration and granulation tissue similar to the CT of the neck in April.  No surrounding inflammation no fluid collection or otherwise adverse features.  PROCEDURES:  Critical Care performed: No  Procedures   MEDICATIONS ORDERED IN ED:  Medications  sodium chloride  0.9 % bolus 1,000 mL (0 mLs Intravenous Stopped 03/19/24 1039)  fentaNYL  (SUBLIMAZE ) injection 100 mcg (100 mcg Intravenous Given 03/19/24 0906)  ondansetron  (ZOFRAN ) injection 4 mg (4 mg Intravenous Given 03/19/24 0905)  gadobutrol (GADAVIST) 1 MMOL/ML injection 10 mL (10 mLs Intravenous Contrast Given 03/19/24 1030)  sodium chloride  0.9 % bolus 1,000 mL (0 mLs Intravenous Stopped 03/19/24 1308)  ketorolac  (TORADOL ) 30 MG/ML injection 30 mg (30 mg Intravenous Given 03/19/24 1255)     IMPRESSION / MDM / ASSESSMENT AND PLAN / ED COURSE   I have reviewed the triage  note.  Differential diagnosis includes, but is not limited to, lymphadenopathy, cystic lesions, abscess, viral syndrome, musculoskeletal strain, hematomas, malignancy.  Patient's presentation is most consistent with acute presentation with potential threat to life or bodily function.  33 year old male presenting to the emergency department for treatment and evaluation of progressively worsening neck pain.  See HPI for further details.  Today, the difference between symptoms is that the pain and sensation of swelling is on both the left and right side of the neck.  See HPI for further details.  Triage vital signs indicate that he is hypertensive but is asymptomatic at this time.  He is also slightly tachycardic with a heart rate of 108.  Afebrile, not tachypneic, and SpO2 is 96% on room air.  Patient states that he has not been able to follow-up outpatient as he does not currently have any health insurance.  Previous ER records reviewed.  He has been evaluated 5 times this year for neck pain.  He has had 4 CT soft tissue neck with contrast studies.  The most recent was on 02/09/2024 which showed no new abnormality compared to the previous one in March.  That CT showed a focus of subcutaneous soft tissue thickening/induration in the posterior upper neck which was stable or slightly improved from the study in February.  There was no drainable fluid collection or deep space inflammation identified.  He was evaluated by bedside ultrasound which showed a fluid collection on the right side approximately 1 cm deep to the skin.  Previous lab studies have all indicated a mild leukocytosis and hyperglycemia.  Patient states that he has taken all of the antibiotics as prescribed until finished.  He is currently taking metformin  twice per day and last took it yesterday evening.  He also reports that he is taking his blood pressure medication as prescribed and the last dose was yesterday evening as well.  Lab  studies today also indicate a mild leukocytosis of 12.2 which seems to be at or near the patient's baseline.  He is hyperglycemic with a glucose of 409 and a related sodium of 128 and chloride of 95.  Anion gap is normal at 7.  Liver and kidney function are normal.  HIV screen had not been performed previously and is negative today.  Mononucleosis test is negative.  RPR is in process.  Case discussed with ED attending, Dr. Valetta Gaudy.  Due to progression of symptoms, pain, and continued leukocytosis plan will be to get MRI imaging with contrast as the CT  scans and ultrasound have not provided any definitive diagnosis.  Patient is agreeable to the plan.  MRI soft tissue neck shows no acute findings.  These results were discussed with the patient and he was advised that he has had very thorough and nonemergent findings over several ER visits and all of his imaging and labs.  He was advised that unless there are new concerns or changes in symptoms he needs to follow-up outpatient if the neck pain continues.  He states that he has an upcoming appointment with primary care and was encouraged to keep that appointment.  He was also advised that his blood glucose has been chronically elevated despite taking his metformin .  He was encouraged to adhere to a strict diabetic diet and we reviewed foods that he should avoid.  He was also made aware that his blood pressure is elevated and he needs to decrease the amount of sodium intake in his foods and stop  if he is drinking sodas.  He reports that he has not yet taken his metformin  or his blood pressure medication today but was encouraged to do so when he gets home.       FINAL CLINICAL IMPRESSION(S) / ED DIAGNOSES   Final diagnoses:  Hyperglycemia due to diabetes mellitus (HCC)  Uncontrolled hypertension  Neck pain     Rx / DC Orders   ED Discharge Orders          Ordered    traMADol  (ULTRAM ) 50 MG tablet  Every 6 hours PRN        03/19/24 1253     cyclobenzaprine  (FLEXERIL ) 10 MG tablet  3 times daily PRN        03/19/24 1253             Note:  This document was prepared using Dragon voice recognition software and may include unintentional dictation errors.   Sherryle Don, FNP 03/19/24 1530    Iver Marker, MD 03/20/24 1534

## 2024-03-19 NOTE — Discharge Instructions (Addendum)
 We have thoroughly evaluated the pain that you are experiencing in your neck.  You will need to see your primary care provider if pain is not improving.  Today, there is no indication of infection or emergent condition.  Your blood sugar is too high.  You need to stick to a diabetic diet and decrease your carbohydrate and sugar intake.  Your blood pressure is also too high.  You need to reduce the amount of sodium/salt that you are consuming in foods or sodas.

## 2024-03-19 NOTE — ED Triage Notes (Signed)
 Pt presents via POV c/o neck pain since last week. Reports seen last week for same but symptoms are worsening.

## 2024-03-20 LAB — RPR: RPR Ser Ql: NONREACTIVE

## 2024-11-05 ENCOUNTER — Emergency Department: Payer: Self-pay

## 2024-11-05 ENCOUNTER — Other Ambulatory Visit: Payer: Self-pay

## 2024-11-05 ENCOUNTER — Emergency Department
Admission: EM | Admit: 2024-11-05 | Discharge: 2024-11-05 | Disposition: A | Discharge status: Home / Self Care | Payer: Self-pay | Attending: Emergency Medicine | Admitting: Emergency Medicine

## 2024-11-05 DIAGNOSIS — W208XXA Other cause of strike by thrown, projected or falling object, initial encounter: Secondary | ICD-10-CM | POA: Insufficient documentation

## 2024-11-05 DIAGNOSIS — S161XXA Strain of muscle, fascia and tendon at neck level, initial encounter: Secondary | ICD-10-CM | POA: Insufficient documentation

## 2024-11-05 MED ORDER — ACETAMINOPHEN 500 MG PO TABS
ORAL_TABLET | ORAL | Status: AC
  Filled 2024-11-05: qty 2

## 2024-11-05 MED ORDER — METHOCARBAMOL 500 MG PO TABS: 500.0000 mg | ORAL_TABLET | Freq: Three times a day (TID) | ORAL | 1 refills | Status: AC | PRN

## 2024-11-05 MED ORDER — ACETAMINOPHEN 500 MG PO TABS
1000.0000 mg | ORAL_TABLET | Freq: Once | ORAL | Status: AC
  Administered 2024-11-05: 1000 mg via ORAL

## 2024-11-05 MED ORDER — KETOROLAC TROMETHAMINE 30 MG/ML IJ SOLN
30.0000 mg | Freq: Once | INTRAMUSCULAR | Status: AC
  Administered 2024-11-05: 30 mg via INTRAMUSCULAR
  Filled 2024-11-05: qty 1

## 2024-11-05 MED ORDER — TRAMADOL HCL 50 MG PO TABS: 50.0000 mg | ORAL_TABLET | Freq: Four times a day (QID) | ORAL | 0 refills | Status: AC | PRN

## 2024-11-05 NOTE — ED Provider Notes (Signed)
 "  [facility] Provider Note    Event Date/Time   First MD Initiated Contact with Patient 11/05/24 1114     (approximate)   History   Neck Injury   HPI  Rodney Jones. is a 34 y.o. male who reports about 4 days ago a shelf gave out and fell on the back of his neck.  He reports that had a heavy TV on it.  Since then he has had tightness in his posterior neck bilaterally with radiation to his shoulders.  No upper extremity weakness or tingling.     Physical Exam   Triage Vital Signs: ED Triage Vitals  Encounter Vitals Group     BP 11/05/24 1036 (!) 160/111     Girls Systolic BP Percentile --      Girls Diastolic BP Percentile --      Boys Systolic BP Percentile --      Boys Diastolic BP Percentile --      Pulse Rate 11/05/24 1036 (!) 105     Resp 11/05/24 1036 18     Temp 11/05/24 1036 98 F (36.7 C)     Temp Source 11/05/24 1036 Oral     SpO2 11/05/24 1036 97 %     Weight 11/05/24 1036 121.1 kg (267 lb)     Height 11/05/24 1036 1.727 m (5' 8)     Head Circumference --      Peak Flow --      Pain Score 11/05/24 1035 10     Pain Loc --      Pain Education --      Exclude from Growth Chart --     Most recent vital signs: Vitals:   11/05/24 1036 11/05/24 1238  BP: (!) 160/111 (!) 158/114  Pulse: (!) 105 99  Resp: 18 18  Temp: 98 F (36.7 C) 98 F (36.7 C)  SpO2: 97% 98%     General: Awake, no distress.  CV:  Good peripheral perfusion.  Resp:  Normal effort.  Abd:  No distention.  Other:  Normal strength in the upper extremities, posterior cervical tenderness at approximately C3, no bony abnormalities palpated.   ED Results / Procedures / Treatments   Labs (all labs ordered are listed, but only abnormal results are displayed) Labs Reviewed - No data to display   EKG     RADIOLOGY CT cervical spine without evidence of bony injury    PROCEDURES:  Critical Care performed:   Procedures   MEDICATIONS ORDERED IN  ED: Medications  ketorolac  (TORADOL ) 30 MG/ML injection 30 mg (30 mg Intramuscular Given 11/05/24 1158)  acetaminophen  (TYLENOL ) tablet 1,000 mg ( Oral Not Given 11/05/24 1237)     IMPRESSION / MDM / ASSESSMENT AND PLAN / ED COURSE  I reviewed the triage vital signs and the nursing notes. Patient's presentation is most consistent with acute presentation with potential threat to life or bodily function.  Patient presents with trauma to the neck as described above, will obtain CT to rule out cervical fracture which is on the differential, muscle spasm also a possibility, will treat with IM Toradol    CT cervical spine without acute abnormality, given reassuring neuroexam, reassuring imaging most consistent with muscle spasm/strain  Analgesics and muscle relaxer prescription, outpatient follow-up recommended       FINAL CLINICAL IMPRESSION(S) / ED DIAGNOSES   Final diagnoses:  Acute strain of neck muscle, initial encounter     Rx / DC Orders   ED  Discharge Orders          Ordered    traMADol  (ULTRAM ) 50 MG tablet  Every 6 hours PRN        11/05/24 1222    methocarbamol  (ROBAXIN ) 500 MG tablet  Every 8 hours PRN        11/05/24 1222             Note:  This document was prepared using Dragon voice recognition software and may include unintentional dictation errors.   Arlander Charleston, MD 11/05/24 1303  "

## 2024-11-05 NOTE — ED Triage Notes (Signed)
 Pt to ED for neck pain and stiffness since neck injury 4 days ago while lifting heavy boxes and then a piece of furniture (shelf) fell onto him and hit his lower neck. States painful to turn head and has been out of work last several days and they need a note to allow him to go back to work. States the pain is more to both sides rather than midline of neck. Full ROM to both arms.
